# Patient Record
Sex: Male | Born: 1955 | Race: White | Hispanic: No | Marital: Married | Smoking: Former smoker
Health system: Southern US, Community
[De-identification: ages and names within clinical notes are randomized; demographics above are authoritative.]

## PROBLEM LIST (undated history)

## (undated) DIAGNOSIS — M1990 Unspecified osteoarthritis, unspecified site: Secondary | ICD-10-CM

## (undated) HISTORY — DX: Unspecified osteoarthritis, unspecified site: M19.90

---

## 2000-05-15 ENCOUNTER — Encounter: Payer: Self-pay | Admitting: Physical Medicine and Rehabilitation

## 2000-05-15 ENCOUNTER — Ambulatory Visit (HOSPITAL_COMMUNITY)
Admission: RE | Admit: 2000-05-15 | Discharge: 2000-05-15 | Payer: Self-pay | Admitting: Physical Medicine and Rehabilitation

## 2001-05-12 ENCOUNTER — Encounter: Payer: Self-pay | Admitting: Physical Medicine and Rehabilitation

## 2001-05-12 ENCOUNTER — Ambulatory Visit (HOSPITAL_COMMUNITY)
Admission: RE | Admit: 2001-05-12 | Discharge: 2001-05-12 | Payer: Self-pay | Admitting: Physical Medicine and Rehabilitation

## 2004-11-13 ENCOUNTER — Emergency Department (HOSPITAL_COMMUNITY): Admission: EM | Admit: 2004-11-13 | Discharge: 2004-11-13 | Payer: Self-pay | Admitting: Emergency Medicine

## 2007-03-14 ENCOUNTER — Encounter: Admission: RE | Admit: 2007-03-14 | Discharge: 2007-03-14 | Payer: Self-pay | Admitting: Family Medicine

## 2007-04-11 ENCOUNTER — Ambulatory Visit: Payer: Self-pay | Admitting: Internal Medicine

## 2007-05-17 ENCOUNTER — Encounter: Payer: Self-pay | Admitting: Internal Medicine

## 2007-05-17 ENCOUNTER — Ambulatory Visit: Payer: Self-pay | Admitting: Internal Medicine

## 2007-09-11 DIAGNOSIS — R5383 Other fatigue: Secondary | ICD-10-CM

## 2007-09-11 DIAGNOSIS — K648 Other hemorrhoids: Secondary | ICD-10-CM | POA: Insufficient documentation

## 2007-09-11 DIAGNOSIS — K2970 Gastritis, unspecified, without bleeding: Secondary | ICD-10-CM | POA: Insufficient documentation

## 2007-09-11 DIAGNOSIS — R5381 Other malaise: Secondary | ICD-10-CM | POA: Insufficient documentation

## 2007-09-11 DIAGNOSIS — J309 Allergic rhinitis, unspecified: Secondary | ICD-10-CM | POA: Insufficient documentation

## 2007-09-11 DIAGNOSIS — R1013 Epigastric pain: Secondary | ICD-10-CM | POA: Insufficient documentation

## 2007-09-11 DIAGNOSIS — K219 Gastro-esophageal reflux disease without esophagitis: Secondary | ICD-10-CM

## 2007-09-11 DIAGNOSIS — K2990 Gastroduodenitis, unspecified, without bleeding: Secondary | ICD-10-CM

## 2010-11-16 NOTE — Assessment & Plan Note (Signed)
[facility]                         GASTROENTEROLOGY OFFICE NOTE   NAME:Kevin Brady, Kevin Brady                       MRN:          [id number]  DATE:04/11/2007                            DOB:          Apr 09, 1956    CHIEF COMPLAINT:  Pressure and abdominal pain in the epigastric area,  plus colon cancer screening.   ASSESSMENT:  9. A 55 year old white man who has been having problems with      epigastric pressure and pain, often postprandial, but not always,      ever since he took a course of NSAID for an injury several years      ago.  Recently Nexium seemed to help, but then he developed      constipation.  He is not completely relieved at this time.  2. He is 51 and averages for colon cancer screening.   RECOMMENDATIONS AND PLAN:  1. Schedule upper GI endoscopy to investigate this epigastric pain.  2. Schedule screening colonoscopy.  3. He had an abdominal ultrasound.  He says he has had labs and these      are fine.  4. Note, he complains of some fatigue and lab work is negative.  He      snores and sometimes has daily headaches as well as this fatigue,      despite 7 to 8 hours of sleep.  He has had a previous nasal injury.      He does not seem to have the typical body habitus for somebody with      sleep apnea, but that is possible, and he will discuss this with      Dr. Christell Constant (i.e., possible sleep test, will defer to Dr. Kathi Der      judgment).   HISTORY:  A 55 year old white man with problems as outlined above.  See  medical history form for further details.  GI review of systems is  otherwise negative.   PAST MEDICAL HISTORY:  1. History of epidural injection for back problems.  2. Nasal fracture.  3. Allergies have been a recent problem.  Started taking loratadine.  4. Fatigue.  Unclear etiology.   MEDICATIONS:  1. Loratadine 10 mg daily.  2. Multivitamin daily.   ALLERGIES:  PENICILLIN.   FAMILY HISTORY:  Diabetes in his mother.   Father had heart disease and  Wolff-Parkinson-White Syndrome.   SOCIAL HISTORY:  He is married.  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  Previously worked in Designer, fashion/clothing for 20 years.  He has been at  [facility] for 10 years.  Two daughters.  Lives with his wife.  Seldom  alcohol.  No tobacco.   REVIEW OF SYSTEMS:  As above.  See medical history form.   PHYSICAL EXAM:  Height 5 feet 10 inches, weight 163 pounds, blood  pressure 120/82, pulse 78.  Body mass index 23.4 kg per meter squared.  EYES:  Anicteric.  MOUTH:  Posterior pharynx free of lesions.  Teeth in good repair.  NECK:  Supple.  No thyromegaly or mass.  CHEST:  Clear.  HEART:  S1, S2.  No  murmurs, rubs, or gallops.  ABDOMEN:  Soft.  Somewhat protuberant.  Mildly obese.  No organomegaly  or mass.  RECTAL:  Deferred.  LOWER EXTREMITIES:  No edema.  LYMPHATICS:  No neck or supraclavicular adenopathy detected.  SKIN:  Warm and dry.  No acute rash.  PSYCH:  He is alert and oriented x3.   Ultrasound report negative, reviewed.  I reviewed Dr. Kathi Der office  notes as well.   I appreciate the opportunity to care for this patient.     Iva Boop, MD,FACG  Electronically Signed    CEG/MedQ  DD: 04/11/2007  DT: 04/12/2007  Job #: [id number]   cc:   Ernestina Penna, M.D.

## 2013-01-21 ENCOUNTER — Telehealth: Payer: Self-pay | Admitting: Family Medicine

## 2013-01-21 NOTE — Telephone Encounter (Signed)
appt given for wed 23 at 3:30 with moore.

## 2013-01-23 ENCOUNTER — Ambulatory Visit (INDEPENDENT_AMBULATORY_CARE_PROVIDER_SITE_OTHER): Payer: 59 | Admitting: Family Medicine

## 2013-01-23 ENCOUNTER — Encounter: Payer: Self-pay | Admitting: Family Medicine

## 2013-01-23 ENCOUNTER — Ambulatory Visit (INDEPENDENT_AMBULATORY_CARE_PROVIDER_SITE_OTHER): Payer: 59

## 2013-01-23 VITALS — BP 136/80 | HR 76 | Temp 98.5°F | Ht 68.5 in | Wt 173.2 lb

## 2013-01-23 DIAGNOSIS — R51 Headache: Secondary | ICD-10-CM

## 2013-01-23 DIAGNOSIS — R221 Localized swelling, mass and lump, neck: Secondary | ICD-10-CM

## 2013-01-23 DIAGNOSIS — J309 Allergic rhinitis, unspecified: Secondary | ICD-10-CM

## 2013-01-23 LAB — POCT CBC
Granulocyte percent: 69.6 %G (ref 37–80)
HCT, POC: 42.2 % — AB (ref 43.5–53.7)
Hemoglobin: 14.2 g/dL (ref 14.1–18.1)
Lymph, poc: 1.9 (ref 0.6–3.4)
MCH, POC: 30.6 pg (ref 27–31.2)
MCHC: 33.7 g/dL (ref 31.8–35.4)
MCV: 90.7 fL (ref 80–97)
MPV: 8.2 fL (ref 0–99.8)
POC Granulocyte: 5.1 (ref 2–6.9)
POC LYMPH PERCENT: 26.6 %L (ref 10–50)
Platelet Count, POC: 274 10*3/uL (ref 142–424)
RBC: 4.7 M/uL (ref 4.69–6.13)
RDW, POC: 13 %
WBC: 7.3 10*3/uL (ref 4.6–10.2)

## 2013-01-23 MED ORDER — FLUTICASONE PROPIONATE 50 MCG/ACT NA SUSP: 2.0000 | Freq: Every day | NASAL | Status: DC

## 2013-01-23 MED ORDER — NAPROXEN 500 MG PO TABS: 500.0000 mg | ORAL_TABLET | Freq: Two times a day (BID) | ORAL | Status: DC

## 2013-01-23 NOTE — Progress Notes (Signed)
Subjective:    Patient ID: Kevin Brady, male    DOB: 1956-04-23, 57 y.o.   MRN: [id number]  HPI Patient comes in today complaining of a left occipital knot for about one year. He describes frontal headaches off and on during that time. The area in question has not changed much in size since first noticed. Milder NSAIDs do not help but stronger NSAIDs have been noted to help this pain.   Review of Systems  Constitutional: Negative.   Eyes: Negative.   Respiratory: Negative.   Cardiovascular: Negative.   Gastrointestinal: Negative.   Skin: Positive for wound (cyst on neck).  Allergic/Immunologic: Positive for environmental allergies.  Neurological: Positive for headaches.  Psychiatric/Behavioral: The patient is nervous/anxious (anxiety).        Objective:   Physical Exam  Constitutional: He is oriented to person, place, and time. He appears well-developed and well-nourished. No distress.  HENT:  Head: Normocephalic and atraumatic.  Right Ear: External ear normal.  Left Ear: External ear normal.  Nose: Nose normal.  Mouth/Throat: No oropharyngeal exudate.  Eyes: Conjunctivae are normal. Right eye exhibits no discharge. Left eye exhibits no discharge. No scleral icterus.  Neck: Normal range of motion. Neck supple. No thyromegaly present.  Cardiovascular: Normal rate, regular rhythm and normal heart sounds.  Exam reveals no gallop and no friction rub.   No murmur heard. Pulmonary/Chest: Effort normal and breath sounds normal. No respiratory distress. He has no wheezes.  Abdominal: Soft. Bowel sounds are normal. He exhibits no distension. There is no tenderness. There is no rebound and no guarding.  Musculoskeletal: Normal range of motion. He exhibits no edema and no tenderness.  Lymphadenopathy:    He has no cervical adenopathy.  Neurological: He is alert and oriented to person, place, and time. He has normal reflexes.  Skin: Skin is warm and dry. No rash noted. He is not  diaphoretic. No erythema. No pallor.  There is a small wound in the left occipital scalp in the cervical spine area  Psychiatric: He has a normal mood and affect. His behavior is normal. Thought content normal.  Somewhat anxious about his condition     [facility] reading (PRIMARY) by  Dr. Christell Constant: Chest x-ray : Within normal limits                                Results for orders placed in visit on 01/23/13  POCT CBC      Result Value Range   WBC 7.3  4.6 - 10.2 K/uL   Lymph, poc 1.9  0.6 - 3.4   POC LYMPH PERCENT 26.6  10 - 50 %L   POC Granulocyte 5.1  2 - 6.9   Granulocyte percent 69.6  37 - 80 %G   RBC 4.7  4.69 - 6.13 M/uL   Hemoglobin 14.2  14.1 - 18.1 g/dL   HCT, POC 10.2 (*) 72.5 - 53.7 %   MCV 90.7  80 - 97 fL   MCH, POC 30.6  27 - 31.2 pg   MCHC 33.7  31.8 - 35.4 g/dL   RDW, POC 36.6     Platelet Count, POC 274.0  142 - 424 K/uL   MPV 8.2  0 - 99.8 fL          Assessment & Plan:  1. Frequent headaches - POCT CBC - naproxen (NAPROSYN) 500 MG tablet; Take 1 tablet (500 mg total) by mouth 2 (  two) times daily with a meal.  Dispense: 30 tablet; Refill: 0  2. Mass of left side of neck - POCT CBC - DG Chest 2 View; Future  3. Allergic rhinitis - fluticasone (FLONASE) 50 MCG/ACT nasal spray; Place 2 sprays into the nose daily.  Dispense: 16 g; Refill: 6  Patient Instructions  Use nose spray as directed Take Naprosyn after breakfast and supper, if it bothers the stomach, discontinue the medication Use warm wet compresses to neck 20 minutes 3 or 4 times daily We will arrange an appointment with a surgeon to look at the knot   Nyra Capes MD

## 2013-01-23 NOTE — Patient Instructions (Addendum)
Use nose spray as directed Take Naprosyn after breakfast and supper, if it bothers the stomach, discontinue the medication Use warm wet compresses to neck 20 minutes 3 or 4 times daily We will arrange an appointment with a surgeon to look at the knot

## 2013-02-01 ENCOUNTER — Other Ambulatory Visit: Payer: Self-pay | Admitting: Physician Assistant

## 2013-02-07 ENCOUNTER — Ambulatory Visit (INDEPENDENT_AMBULATORY_CARE_PROVIDER_SITE_OTHER): Payer: 59 | Admitting: Surgery

## 2013-03-01 ENCOUNTER — Ambulatory Visit (INDEPENDENT_AMBULATORY_CARE_PROVIDER_SITE_OTHER): Payer: 59 | Admitting: Surgery

## 2013-03-06 ENCOUNTER — Ambulatory Visit (INDEPENDENT_AMBULATORY_CARE_PROVIDER_SITE_OTHER): Payer: 59 | Admitting: Surgery

## 2013-03-06 ENCOUNTER — Encounter (INDEPENDENT_AMBULATORY_CARE_PROVIDER_SITE_OTHER): Payer: Self-pay | Admitting: Surgery

## 2013-03-06 VITALS — BP 130/80 | HR 74 | Resp 14 | Ht 71.0 in | Wt 167.8 lb

## 2013-03-06 DIAGNOSIS — R220 Localized swelling, mass and lump, head: Secondary | ICD-10-CM

## 2013-03-06 DIAGNOSIS — R221 Localized swelling, mass and lump, neck: Secondary | ICD-10-CM

## 2013-03-06 NOTE — Progress Notes (Signed)
Patient ID: Kevin Brady, male   DOB: [birth year], 57 y.o.   MRN: [id number]  Chief Complaint  Patient presents with  . New Evaluation    eval neck mass    HPI Kevin Brady is a 57 y.o. male.  Patient sent at request of Dr. Christell Constant due to  mass just below his left occiput . It has been present for one year. It is not getting larger. It is not tender. He has had problems with headaches and sinus congestion recently. He is under a lot of stress at work. He was placed on Naprosyn and his headaches have gone away. He still has nasal congestion that he attributes to allergies. The mass over his neck is not tender, is not red, it is not draining and is stable in size for one year. HPI  History reviewed. No pertinent past medical history.  History reviewed. No pertinent past surgical history.  History reviewed. No pertinent family history.  Social History History  Substance Use Topics  . Smoking status: Former Smoker -- 0.50 packs/day    Types: Cigarettes    Start date: 03/05/1975    Quit date: 03/04/1977  . Smokeless tobacco: Never Used  . Alcohol Use: Yes     Comment: rare    Allergies  Allergen Reactions  . Penicillins Rash  . Nexium [Esomeprazole Magnesium] Other (See Comments)    constipation  . Paxil [Paroxetine Hcl] Other (See Comments)    fatigue    Current Outpatient Prescriptions  Medication Sig Dispense Refill  . fluticasone (FLONASE) 50 MCG/ACT nasal spray Place 2 sprays into the nose daily.  16 g  6  . naproxen (NAPROSYN) 500 MG tablet Take 1 tablet (500 mg total) by mouth 2 (two) times daily with a meal.  30 tablet  0   No current facility-administered medications for this visit.    Review of Systems Review of Systems  Constitutional: Negative.   HENT: Positive for congestion, rhinorrhea and neck pain.   Respiratory: Negative.   Cardiovascular: Negative.   Endocrine: Negative.   Genitourinary: Negative.   Musculoskeletal: Positive for arthralgias.    Allergic/Immunologic: Negative.   Neurological: Positive for headaches.  Hematological: Negative.   Psychiatric/Behavioral: Negative.     Blood pressure 130/80, pulse 74, resp. rate 14, height 5\' 11"  (1.803 m), weight 167 lb 12.8 oz (76.114 kg).  Physical Exam Physical Exam  Constitutional: He is oriented to person, place, and time. He appears well-developed and well-nourished.  HENT:  Head: Normocephalic and atraumatic.  Eyes: EOM are normal. Pupils are equal, round, and reactive to light.  Neck: Normal range of motion. Neck supple.  Musculoskeletal: Normal range of motion.  Neurological: He is alert and oriented to person, place, and time.  Skin:     Psychiatric: He has a normal mood and affect. His behavior is normal. Judgment and thought content normal.    Data Reviewed none  Assessment    Left posterior neck mass probable lipoma versus sebaceous cyst    Plan    Discussed with patient excision versus observation. The pros and cons of each were discussed. The potential for malignancy this case is very low. He would like to continue to observe this for now since his other symptoms are better. Patient told to call office if the area gets larger, becomes more hard, becomes red, or drains. He understands and agrees.       Malillany Kazlauskas A. 03/06/2013, 10:06 AM

## 2013-03-06 NOTE — Patient Instructions (Signed)

## 2013-09-04 ENCOUNTER — Other Ambulatory Visit: Payer: Self-pay | Admitting: *Deleted

## 2013-09-04 NOTE — Telephone Encounter (Signed)
Pt want muscle relaxer - ntbs!!

## 2013-09-23 ENCOUNTER — Ambulatory Visit (INDEPENDENT_AMBULATORY_CARE_PROVIDER_SITE_OTHER): Payer: 59

## 2013-09-23 ENCOUNTER — Ambulatory Visit (INDEPENDENT_AMBULATORY_CARE_PROVIDER_SITE_OTHER): Payer: 59 | Admitting: Family Medicine

## 2013-09-23 ENCOUNTER — Encounter: Payer: Self-pay | Admitting: Family Medicine

## 2013-09-23 VITALS — BP 145/83 | HR 64 | Temp 98.7°F | Ht 71.0 in | Wt 162.0 lb

## 2013-09-23 DIAGNOSIS — M25519 Pain in unspecified shoulder: Secondary | ICD-10-CM

## 2013-09-23 DIAGNOSIS — M25511 Pain in right shoulder: Secondary | ICD-10-CM

## 2013-09-23 DIAGNOSIS — J309 Allergic rhinitis, unspecified: Secondary | ICD-10-CM

## 2013-09-23 MED ORDER — CETIRIZINE HCL 10 MG PO TABS: 10.0000 mg | ORAL_TABLET | Freq: Every day | ORAL | Status: DC

## 2013-09-23 MED ORDER — CYCLOBENZAPRINE HCL 10 MG PO TABS: 10.0000 mg | ORAL_TABLET | Freq: Three times a day (TID) | ORAL | Status: DC | PRN

## 2013-09-23 MED ORDER — FLUTICASONE PROPIONATE 50 MCG/ACT NA SUSP: 2.0000 | Freq: Every day | NASAL | Status: DC

## 2013-09-23 NOTE — Progress Notes (Signed)
Subjective:    Patient ID: Kevin Brady, male    DOB: 08-Oct-1955, 58 y.o.   MRN: [id number]  HPI Patient here today for right shoulder pain. The patient indicates it hurts most with abduction. He has been hurting for 2-3 months. He does not describe any injury associated with this right shoulder pain. It does hurt to raise it or reach behind. He also complains of allergic rhinitis and his continued back pain. He would like for [place] to refill his Flonase and his Naprosyn.      Patient Active Problem List   Diagnosis Date Noted  . INTERNAL HEMORRHOIDS 09/11/2007  . ALLERGIC RHINITIS 09/11/2007  . GERD 09/11/2007  . GASTRITIS 09/11/2007  . FATIGUE 09/11/2007  . ABDOMINAL PAIN, EPIGASTRIC 09/11/2007   Outpatient Encounter Prescriptions as of 09/23/2013  Medication Sig  . cetirizine (ZYRTEC) 10 MG tablet Take 10 mg by mouth daily.  . fluticasone (FLONASE) 50 MCG/ACT nasal spray Place 2 sprays into the nose daily.  . [DISCONTINUED] naproxen (NAPROSYN) 500 MG tablet Take 1 tablet (500 mg total) by mouth 2 (two) times daily with a meal.    Review of Systems  Constitutional: Negative.   HENT: Negative.   Eyes: Negative.   Respiratory: Negative.   Cardiovascular: Negative.   Gastrointestinal: Negative.   Endocrine: Negative.   Genitourinary: Negative.   Musculoskeletal: Positive for arthralgias (right shoulder pain).  Skin: Negative.   Allergic/Immunologic: Negative.   Neurological: Negative.   Hematological: Negative.   Psychiatric/Behavioral: Negative.        Objective:   Physical Exam  Nursing note and vitals reviewed. Constitutional: He is oriented to person, place, and time. He appears well-developed and well-nourished. No distress.  HENT:  Head: Normocephalic and atraumatic.  Right Ear: External ear normal.  Left Ear: External ear normal.  Nose: Nose normal.  Mouth/Throat: Oropharynx is clear and moist. No oropharyngeal exudate.  Eyes: Conjunctivae and EOM are  normal. Pupils are equal, round, and reactive to light. Right eye exhibits no discharge. Left eye exhibits no discharge. No scleral icterus.  Neck: Normal range of motion. Neck supple. No thyromegaly present.  There is a small fatty tumor or in the left posterior neck that the patient is asymptomatic with.  Cardiovascular: Normal rate, regular rhythm, normal heart sounds and intact distal pulses.  Exam reveals no gallop and no friction rub.   No murmur heard. Pulmonary/Chest: Effort normal and breath sounds normal. No respiratory distress. He has no wheezes. He has no rales. He exhibits no tenderness.  Abdominal: Soft. Bowel sounds are normal.  Genitourinary: Rectum normal, prostate normal and penis normal.  Musculoskeletal: Normal range of motion. He exhibits tenderness (there was slight point tenderness distal to the acromioclavicular joint above the humerus.). He exhibits no edema.  There is pretty normal range of motion with the right humerus but increased pain with abduction and in the superior shoulder area.  Lymphadenopathy:    He has no cervical adenopathy.  Neurological: He is alert and oriented to person, place, and time.  Skin: Skin is warm and dry. No rash noted.  Psychiatric: He has a normal mood and affect. His behavior is normal. Judgment and thought content normal.   BP 145/83  Pulse 64  Temp(Src) 98.7 F (37.1 C) (Oral)  Ht 5\' 11"  (1.803 m)  Wt 162 lb (73.483 kg)  BMI 22.60 kg/m2  1 cc of Marcaine and 20 mg of Kenalog were injected in the area of point tenderness after sterile preparation  without problems to the patient. A pressure dressing was applied  [facility] reading (PRIMARY) by  Dr. Governor Rooks shoulder, no significant finding                                     Assessment & Plan:  1. Right shoulder pain - DG Shoulder Right; Future  2. Allergic rhinitis - fluticasone (FLONASE) 50 MCG/ACT nasal spray; Place 2 sprays into both nostrils daily.  Dispense: 16 g;  Refill: 6 Meds ordered this encounter  Medications  . DISCONTD: cetirizine (ZYRTEC) 10 MG tablet    Sig: Take 10 mg by mouth daily.  . fluticasone (FLONASE) 50 MCG/ACT nasal spray    Sig: Place 2 sprays into both nostrils daily.    Dispense:  16 g    Refill:  6  . cetirizine (ZYRTEC) 10 MG tablet    Sig: Take 1 tablet (10 mg total) by mouth daily.    Dispense:  30 tablet    Refill:  6  . cyclobenzaprine (FLEXERIL) 10 MG tablet    Sig: Take 1 tablet (10 mg total) by mouth 3 (three) times daily as needed for muscle spasms.    Dispense:  30 tablet    Refill:  1   Patient Instructions  You can continue to take the naproxen as needed after breakfast and supper use warm wet compresses to your shoulder If the pain and discomfort are no better and 3-4 weeks please call back and we will arrange for an appointment with the orthopedist     continue to take allergy meds as in the past. Arrie Senate MD

## 2013-09-23 NOTE — Patient Instructions (Signed)
You can continue to take the naproxen as needed after breakfast and supper use warm wet compresses to your shoulder If the pain and discomfort are no better and 3-4 weeks please call back and we will arrange for an appointment with the orthopedist

## 2013-12-03 ENCOUNTER — Ambulatory Visit (INDEPENDENT_AMBULATORY_CARE_PROVIDER_SITE_OTHER): Payer: 59

## 2013-12-03 ENCOUNTER — Telehealth: Payer: Self-pay | Admitting: Family Medicine

## 2013-12-03 ENCOUNTER — Ambulatory Visit (INDEPENDENT_AMBULATORY_CARE_PROVIDER_SITE_OTHER): Payer: 59 | Admitting: Family Medicine

## 2013-12-03 ENCOUNTER — Encounter: Payer: Self-pay | Admitting: Family Medicine

## 2013-12-03 VITALS — BP 143/86 | HR 68 | Temp 98.3°F | Ht 71.0 in | Wt 148.0 lb

## 2013-12-03 DIAGNOSIS — S6000XA Contusion of unspecified finger without damage to nail, initial encounter: Secondary | ICD-10-CM

## 2013-12-03 DIAGNOSIS — M79644 Pain in right finger(s): Secondary | ICD-10-CM

## 2013-12-03 DIAGNOSIS — M79609 Pain in unspecified limb: Secondary | ICD-10-CM

## 2013-12-03 NOTE — Patient Instructions (Addendum)
Take 2  ibuprofen 3 times daily after meals Use the splint during the day at work We will call you with the results of the x-rays once those results are available Take Zantac 75 twice daily before breakfast and supper to protect your stomach

## 2013-12-03 NOTE — Progress Notes (Signed)
   Subjective:    Patient ID: Kevin Brady, male    DOB: 05/28/1956, 58 y.o.   MRN: [id number]  HPI Patient here today for middile finger pain of right hand. This happened when his finger was smashed between 2 different objects about 2 weeks ago. The pain continues. There is still slight swelling over the PIP joint.      Patient Active Problem List   Diagnosis Date Noted  . INTERNAL HEMORRHOIDS 09/11/2007  . ALLERGIC RHINITIS 09/11/2007  . GERD 09/11/2007  . GASTRITIS 09/11/2007  . FATIGUE 09/11/2007  . ABDOMINAL PAIN, EPIGASTRIC 09/11/2007   Outpatient Encounter Prescriptions as of 12/03/2013  Medication Sig  . cetirizine (ZYRTEC) 10 MG tablet Take 1 tablet (10 mg total) by mouth daily.  . cyclobenzaprine (FLEXERIL) 10 MG tablet Take 1 tablet (10 mg total) by mouth 3 (three) times daily as needed for muscle spasms.  . fluticasone (FLONASE) 50 MCG/ACT nasal spray Place 2 sprays into both nostrils daily.    Review of Systems  Constitutional: Negative.   HENT: Negative.   Eyes: Negative.   Respiratory: Negative.   Cardiovascular: Negative.   Gastrointestinal: Negative.   Endocrine: Negative.   Genitourinary: Negative.   Musculoskeletal: Negative.        Finger/knuckle pain right hand - middle finger  Skin: Negative.   Allergic/Immunologic: Negative.   Neurological: Negative.   Hematological: Negative.   Psychiatric/Behavioral: Negative.        Objective:   Physical Exam  Nursing note and vitals reviewed. Constitutional: He is oriented to person, place, and time. He appears well-developed and well-nourished.  Musculoskeletal: He exhibits edema. He exhibits no tenderness.  There is slight swelling of the third finger PIP joint of the right hand. There is no redness or sign of infection. There is no rubor. He does have fairly good flexion. There is slight tenderness at the PIP joint.  Neurological: He is alert and oriented to person, place, and time.  Skin: Skin is warm  and dry. No rash noted. No erythema. No pallor.  Psychiatric: He has a normal mood and affect. His behavior is normal. Thought content normal.   BP 143/86  Pulse 68  Temp(Src) 98.3 F (36.8 C) (Oral)  Ht 5\' 11"  (1.803 m)  Wt 148 lb (67.132 kg)  BMI 20.65 kg/m2 [facility] reading (PRIMARY) by  Dr. Festus Barren finger right hand--- no obvious fracture   The finger was splinted just for protection at work with a metal splint he will remove this when he comes home during the day and wear it at work for a few days                                      Assessment & Plan:  1. Finger pain, right - DG Finger Middle Right; Future  2. Finger contusion Patient Instructions  Take 2  ibuprofen 3 times daily after meals Use the splint during the day at work We will call you with the results of the x-rays once those results are available Take Zantac 75 twice daily before breakfast and supper to protect your stomach   Arrie Senate MD

## 2013-12-04 ENCOUNTER — Telehealth: Payer: Self-pay

## 2013-12-04 NOTE — Telephone Encounter (Signed)
Message copied by Koren Bound on Wed Dec 04, 2013  9:33 AM ------      Message from: Chipper Herb      Created: Tue Dec 03, 2013  5:18 PM       Do we need to get More films to further evaluate this?      Please call patient and let him that there could be a small avulsion fracture in the PIP joint.      He needs to wear this splint more regularly ------

## 2013-12-04 NOTE — Telephone Encounter (Signed)
Wife aware of x-ray results; told to call us back in a couple of weeks if still having pain; wear splint more

## 2013-12-04 NOTE — Telephone Encounter (Signed)
[facility]c to [phone] to receive X-ray Results

## 2014-01-06 ENCOUNTER — Telehealth: Payer: Self-pay | Admitting: Family Medicine

## 2014-01-06 NOTE — Telephone Encounter (Signed)
Appt given for tomorrow per patients request 

## 2014-01-07 ENCOUNTER — Encounter: Payer: Self-pay | Admitting: Family Medicine

## 2014-01-07 ENCOUNTER — Ambulatory Visit (INDEPENDENT_AMBULATORY_CARE_PROVIDER_SITE_OTHER): Payer: 59 | Admitting: Family Medicine

## 2014-01-07 VITALS — BP 126/75 | HR 80 | Temp 98.2°F | Ht 71.0 in | Wt 154.6 lb

## 2014-01-07 DIAGNOSIS — S42309S Unspecified fracture of shaft of humerus, unspecified arm, sequela: Secondary | ICD-10-CM

## 2014-01-07 DIAGNOSIS — S62619S Displaced fracture of proximal phalanx of unspecified finger, sequela: Secondary | ICD-10-CM

## 2014-01-07 NOTE — Patient Instructions (Signed)
Keep appt with hand surgeon on 01/09/14 at 2:45

## 2014-01-07 NOTE — Progress Notes (Signed)
   Subjective:    Patient ID: Kevin Brady, male    DOB: 16-Oct-1955, 58 y.o.   MRN: [id number]  HPI Pt here for follow up of Right 3rd finger injury, continued swelling and immobility. The patient is still having swelling and some pain in the right middle finger secondary to the avulsion fracture of the volar plate. He has been using the splint and over the past week has rested the finger because he has not been working for   Review of Systems  Musculoskeletal: Positive for joint swelling (right 3rd finger, continued).       Objective:   Physical Exam BP 126/75  Pulse 80  Temp(Src) 98.2 F (36.8 C) (Oral)  Ht 5\' 11"  (1.803 m)  Wt 154 lb 9.6 oz (70.126 kg)  BMI 21.57 kg/m2  The third finger remains swollen and he has decreased ability to straighten it or flex it.      Assessment & Plan:

## 2014-05-20 ENCOUNTER — Other Ambulatory Visit: Payer: Self-pay | Admitting: Family Medicine

## 2015-04-20 ENCOUNTER — Other Ambulatory Visit: Payer: Self-pay | Admitting: Family Medicine

## 2015-05-05 MED ORDER — AZITHROMYCIN 250 MG PO TABS: ORAL_TABLET | ORAL | Status: DC

## 2015-05-05 NOTE — Addendum Note (Signed)
Addended by: Ilean China on: 05/05/2015 04:32 PM   Modules accepted: Orders

## 2015-09-17 ENCOUNTER — Other Ambulatory Visit: Payer: Self-pay | Admitting: Family Medicine

## 2015-09-18 MED ORDER — FLUTICASONE PROPIONATE 50 MCG/ACT NA SUSP: 2.0000 | Freq: Every day | NASAL | Status: DC

## 2015-09-18 NOTE — Telephone Encounter (Signed)
Refill sent through electronically

## 2015-11-20 ENCOUNTER — Ambulatory Visit (INDEPENDENT_AMBULATORY_CARE_PROVIDER_SITE_OTHER): Payer: Commercial Managed Care - HMO

## 2015-11-20 ENCOUNTER — Ambulatory Visit (INDEPENDENT_AMBULATORY_CARE_PROVIDER_SITE_OTHER): Payer: Commercial Managed Care - HMO | Admitting: Family Medicine

## 2015-11-20 ENCOUNTER — Encounter: Payer: Self-pay | Admitting: Family Medicine

## 2015-11-20 VITALS — BP 126/81 | HR 83 | Temp 98.2°F | Ht 71.0 in | Wt 171.0 lb

## 2015-11-20 DIAGNOSIS — M542 Cervicalgia: Secondary | ICD-10-CM

## 2015-11-20 DIAGNOSIS — G8929 Other chronic pain: Secondary | ICD-10-CM

## 2015-11-20 DIAGNOSIS — Z0000 Encounter for general adult medical examination without abnormal findings: Secondary | ICD-10-CM | POA: Diagnosis not present

## 2015-11-20 DIAGNOSIS — M545 Low back pain: Secondary | ICD-10-CM

## 2015-11-20 DIAGNOSIS — G4700 Insomnia, unspecified: Secondary | ICD-10-CM

## 2015-11-20 DIAGNOSIS — J301 Allergic rhinitis due to pollen: Secondary | ICD-10-CM

## 2015-11-20 DIAGNOSIS — N400 Enlarged prostate without lower urinary tract symptoms: Secondary | ICD-10-CM

## 2015-11-20 LAB — MICROSCOPIC EXAMINATION
BACTERIA UA: NONE SEEN
EPITHELIAL CELLS (NON RENAL): NONE SEEN /HPF (ref 0–10)
RBC, UA: NONE SEEN /hpf (ref 0–?)
WBC, UA: NONE SEEN /hpf (ref 0–?)

## 2015-11-20 LAB — URINALYSIS, COMPLETE
BILIRUBIN UA: NEGATIVE
Glucose, UA: NEGATIVE
Ketones, UA: NEGATIVE
LEUKOCYTES UA: NEGATIVE
Nitrite, UA: NEGATIVE
PH UA: 6 (ref 5.0–7.5)
Protein, UA: NEGATIVE
RBC UA: NEGATIVE
Specific Gravity, UA: 1.015 (ref 1.005–1.030)
Urobilinogen, Ur: 1 mg/dL (ref 0.2–1.0)

## 2015-11-20 MED ORDER — SUVOREXANT 10 MG PO TABS: 1.0000 | ORAL_TABLET | Freq: Every evening | ORAL | Status: DC | PRN

## 2015-11-20 NOTE — Progress Notes (Signed)
Subjective:    Patient ID: Kevin Brady, male    DOB: July 24, 1955, 60 y.o.   MRN: [id number]  HPI Patient is here today for annual wellness exam and follow up of chronic medical problems. He is only using OTC medications. The patient's main complaint is neck pain. He also has allergies that are seasonable related and trouble sleeping. The patient's father is still living and very healthy at 59 years of age. The patient's mother had diabetes and died of a cerebral aneurysm. He has a brother and his sister and he is the oldest of the 3 and both of these siblings are in good health. The patient continues to work for a large and [employer]. He denies any chest pain or shortness of breath. He has no trouble with heartburn indigestion nausea vomiting diarrhea blood in the stool or black tarry bowel movements. The exception to this is that he does have occasional problems with his hemorrhoids and may have some bleeding associated with this. He said he had an endoscopy and a colonoscopy about 8 years ago and we will check on the current date and at that time everything was good. His bowels move regularly and have a normal consistency and color. He's passing his water without problems.     Patient Active Problem List   Diagnosis Date Noted  . INTERNAL HEMORRHOIDS 09/11/2007  . ALLERGIC RHINITIS 09/11/2007  . GERD 09/11/2007  . GASTRITIS 09/11/2007  . FATIGUE 09/11/2007  . ABDOMINAL PAIN, EPIGASTRIC 09/11/2007   Outpatient Encounter Prescriptions as of 11/20/2015  Medication Sig  . cetirizine (ZYRTEC) 10 MG tablet Take 1 tablet (10 mg total) by mouth daily.  . fluticasone (FLONASE) 50 MCG/ACT nasal spray Place 2 sprays into both nostrils daily.  . [DISCONTINUED] azithromycin (ZITHROMAX) 250 MG tablet Take 2 tablets on first day and then 1 tablet daily for 4 days  . [DISCONTINUED] cyclobenzaprine (FLEXERIL) 10 MG tablet Take 1 tablet (10 mg total) by mouth 3 (three) times daily as needed for muscle  spasms.   No facility-administered encounter medications on file as of 11/20/2015.      Review of Systems  Constitutional: Negative.        Trouble sleeping  HENT: Negative.        Seasonal allergies  Eyes: Negative.   Respiratory: Negative.   Cardiovascular: Negative.   Gastrointestinal: Negative.   Endocrine: Negative.   Genitourinary: Negative.   Musculoskeletal: Positive for neck pain.  Skin: Negative.   Allergic/Immunologic: Negative.   Neurological: Negative.   Hematological: Negative.   Psychiatric/Behavioral: Negative.        Objective:   Physical Exam  Constitutional: He is oriented to person, place, and time. He appears well-developed and well-nourished. No distress.  HENT:  Head: Normocephalic and atraumatic.  Right Ear: External ear normal.  Left Ear: External ear normal.  Nose: Nose normal.  Mouth/Throat: Oropharynx is clear and moist. No oropharyngeal exudate.  Minimal congestion. During the visit today as far as nasal is concerned ears are concerned and throat is concerned.  Eyes: Conjunctivae and EOM are normal. Pupils are equal, round, and reactive to light. Right eye exhibits no discharge. Left eye exhibits no discharge. No scleral icterus.  The patient indicates he had an eye exam in the past year.  Neck: Normal range of motion. Neck supple. No thyromegaly present.  No thyromegaly anterior cervical adenopathy or bruits.  Cardiovascular: Normal rate, regular rhythm, normal heart sounds and intact distal pulses.   No murmur heard.  Heart has a regular rate and rhythm at 60/m  Pulmonary/Chest: Effort normal and breath sounds normal. No respiratory distress. He has no wheezes. He has no rales. He exhibits no tenderness.  Clear anteriorly and posteriorly  Abdominal: Soft. Bowel sounds are normal. He exhibits no mass. There is no tenderness. There is no rebound and no guarding.  Generalized slight abdominal tenderness without liver or spleen enlargement and no  inguinal adenopathy and no bruits  Genitourinary: Rectum normal and penis normal.  The prostate was enlarged but soft and smooth. There were no rectal masses. There are no inguinal hernias palpable. Genitalia were within normal limits and the patient is uncircumcised.  Musculoskeletal: Normal range of motion. He exhibits no edema or tenderness.  Lymphadenopathy:    He has no cervical adenopathy.  Neurological: He is alert and oriented to person, place, and time. He has normal reflexes. No cranial nerve deficit.  Skin: Skin is warm and dry. No rash noted.  Psychiatric: He has a normal mood and affect. His behavior is normal. Judgment and thought content normal.  Nursing note and vitals reviewed.  BP 126/81 mmHg  Pulse 83  Temp(Src) 98.2 F (36.8 C) (Oral)  Ht _0  (1.803 m)  Wt 171 lb (77.565 kg)  BMI 23.86 kg/m2  EKG: Low voltage with occasional PAC.  [facility] reading (PRIMARY) by  Dr. Brunilda Payor x-ray with results pending                                         Assessment & Plan:  1. Annual physical exam - BMP8+EGFR; Future - CBC with Differential/Platelet; Future - Hepatic function panel; Future - NMR, lipoprofile; Future - Vitamin B12; Future - Thyroid Panel With TSH; Future - Urinalysis, Complete - EKG 12-Lead - DG Chest 2 View; Future - Fecal occult blood, imunochemical; Future  2. Allergic rhinitis due to pollen -Use Flonase regularly 2 sprays each nostril -Use nasal saline frequently during the day -Use nonsedating antihistamine like Claritin or Allegra  3. Insomnia -Trial of Belsomra starting with 10 mg patient will be informed to increase the 20th 10 doesn't work  4. Chronic low back pain -Continue with over-the-counter NSAIDs making sure to take these after eating. Prescription NSAIDs are stronger and cause more side effects with blood pressure and GI symptoms  5. Neck pain -Over-the-counter NSAIDs after eating  6. BPH (benign prostatic  hyperplasia) -The patient was made aware of the enlarged prostate but currently is having no symptoms with this.  Meds ordered this encounter  Medications  . Suvorexant (BELSOMRA) 10 MG TABS    Sig: Take 1 tablet by mouth at bedtime as needed.    Dispense:  30 tablet    Refill:  0   Patient Instructions  Continue current medications. Continue good therapeutic lifestyle changes which include good diet and exercise. Fall precautions discussed with patient. If an FOBT was given today- please return it to our front desk. If you are over 76 years old - you may need Prevnar 79 or the adult Pneumonia vaccine.   After your visit with [place] today you will receive a survey in the mail or online from [employer] regarding your care with [place]. Please take a moment to fill this out. Your feedback is very important to [place] as you can help [place] better understand your patient needs as well as improve your experience and satisfaction. WE  CARE ABOUT YOU!!!   Continue to be careful do not put yourself at risk for falling Use Flonase regularly 2 sprays each nostril at bedtime Avoid sedating antihistamines like Zyrtec and Benadryl and only use nonsedating antihistamines like Claritin and Allegra Use nasal saline frequently in each nostril Please let us know who did your last colonoscopy so we can enter that information into the record Continue to get eye exams yearly Try Belsomra for sleep start with 10 mg nightly for several nights in a this doesn't work increase it to 20 mg nightly and call us and we will call in a new prescription for 20 mg.   Arrie Senate MD

## 2015-11-20 NOTE — Patient Instructions (Addendum)
Continue current medications. Continue good therapeutic lifestyle changes which include good diet and exercise. Fall precautions discussed with patient. If an FOBT was given today- please return it to our front desk. If you are over 59 years old - you may need Prevnar 26 or the adult Pneumonia vaccine.   After your visit with [place] today you will receive a survey in the mail or online from [employer] regarding your care with [place]. Please take a moment to fill this out. Your feedback is very important to [place] as you can help [place] better understand your patient needs as well as improve your experience and satisfaction. WE CARE ABOUT YOU!!!   Continue to be careful do not put yourself at risk for falling Use Flonase regularly 2 sprays each nostril at bedtime Avoid sedating antihistamines like Zyrtec and Benadryl and only use nonsedating antihistamines like Claritin and Allegra Use nasal saline frequently in each nostril Please let us know who did your last colonoscopy so we can enter that information into the record Continue to get eye exams yearly Try Belsomra for sleep start with 10 mg nightly for several nights in a this doesn't work increase it to 20 mg nightly and call us and we will call in a new prescription for 20 mg.

## 2015-11-21 ENCOUNTER — Other Ambulatory Visit: Payer: Commercial Managed Care - HMO

## 2015-11-21 DIAGNOSIS — Z0000 Encounter for general adult medical examination without abnormal findings: Secondary | ICD-10-CM

## 2015-11-22 LAB — HEPATIC FUNCTION PANEL
ALT: 13 IU/L (ref 0–44)
AST: 20 IU/L (ref 0–40)
Albumin: 4.2 g/dL (ref 3.5–5.5)
Alkaline Phosphatase: 110 IU/L (ref 39–117)
Bilirubin Total: 0.6 mg/dL (ref 0.0–1.2)
Bilirubin, Direct: 0.19 mg/dL (ref 0.00–0.40)
TOTAL PROTEIN: 6.5 g/dL (ref 6.0–8.5)

## 2015-11-22 LAB — CBC WITH DIFFERENTIAL/PLATELET
BASOS ABS: 0.1 10*3/uL (ref 0.0–0.2)
BASOS: 1 %
EOS (ABSOLUTE): 0.1 10*3/uL (ref 0.0–0.4)
Eos: 3 %
Hematocrit: 46.3 % (ref 37.5–51.0)
Hemoglobin: 15.4 g/dL (ref 12.6–17.7)
IMMATURE GRANS (ABS): 0 10*3/uL (ref 0.0–0.1)
Immature Granulocytes: 0 %
LYMPHS: 30 %
Lymphocytes Absolute: 1.3 10*3/uL (ref 0.7–3.1)
MCH: 31.9 pg (ref 26.6–33.0)
MCHC: 33.3 g/dL (ref 31.5–35.7)
MCV: 96 fL (ref 79–97)
MONOS ABS: 0.3 10*3/uL (ref 0.1–0.9)
Monocytes: 6 %
NEUTROS ABS: 2.6 10*3/uL (ref 1.4–7.0)
NEUTROS PCT: 60 %
PLATELETS: 278 10*3/uL (ref 150–379)
RBC: 4.83 x10E6/uL (ref 4.14–5.80)
RDW: 13.1 % (ref 12.3–15.4)
WBC: 4.4 10*3/uL (ref 3.4–10.8)

## 2015-11-22 LAB — BMP8+EGFR
BUN/Creatinine Ratio: 13 (ref 9–20)
BUN: 11 mg/dL (ref 6–24)
CALCIUM: 9.1 mg/dL (ref 8.7–10.2)
CHLORIDE: 103 mmol/L (ref 96–106)
CO2: 24 mmol/L (ref 18–29)
Creatinine, Ser: 0.84 mg/dL (ref 0.76–1.27)
GFR, EST AFRICAN AMERICAN: 112 mL/min/{1.73_m2} (ref >59)
GFR, EST NON AFRICAN AMERICAN: 97 mL/min/{1.73_m2} (ref >59)
Glucose: 89 mg/dL (ref 65–99)
POTASSIUM: 4.7 mmol/L (ref 3.5–5.2)
SODIUM: 143 mmol/L (ref 134–144)

## 2015-11-22 LAB — NMR, LIPOPROFILE
CHOLESTEROL: 172 mg/dL (ref 100–199)
HDL CHOLESTEROL BY NMR: 50 mg/dL (ref >39)
HDL Particle Number: 27.3 umol/L — ABNORMAL LOW (ref >=30.5)
LDL PARTICLE NUMBER: 1173 nmol/L — AB (ref <1000)
LDL Size: 21.3 nm (ref >20.5)
LDL-C: 109 mg/dL — AB (ref 0–99)
LP-IR Score: <25 (ref <=45)
Small LDL Particle Number: 289 nmol/L (ref <=527)
TRIGLYCERIDES BY NMR: 64 mg/dL (ref 0–149)

## 2015-11-22 LAB — THYROID PANEL WITH TSH
FREE THYROXINE INDEX: 2.3 (ref 1.2–4.9)
T3 Uptake Ratio: 29 % (ref 24–39)
T4 TOTAL: 7.9 ug/dL (ref 4.5–12.0)
TSH: 3.14 u[IU]/mL (ref 0.450–4.500)

## 2015-11-22 LAB — VITAMIN B12: Vitamin B-12: 467 pg/mL (ref 211–946)

## 2015-11-23 ENCOUNTER — Telehealth: Payer: Self-pay

## 2015-11-23 NOTE — Telephone Encounter (Signed)
[facility] to x-ray 

## 2015-11-23 NOTE — Telephone Encounter (Signed)
Wife aware of CXR results

## 2015-11-24 ENCOUNTER — Other Ambulatory Visit: Payer: Commercial Managed Care - HMO

## 2015-11-24 DIAGNOSIS — Z0000 Encounter for general adult medical examination without abnormal findings: Secondary | ICD-10-CM

## 2015-11-26 ENCOUNTER — Encounter: Payer: Self-pay | Admitting: *Deleted

## 2015-11-26 LAB — FECAL OCCULT BLOOD, IMMUNOCHEMICAL: FECAL OCCULT BLD: NEGATIVE

## 2015-12-28 ENCOUNTER — Telehealth: Payer: Self-pay | Admitting: Family Medicine

## 2015-12-28 NOTE — Telephone Encounter (Signed)
Should we do the xanax as well?

## 2015-12-28 NOTE — Telephone Encounter (Signed)
Since he says the Belsomra helps some maybe we should go on up to 15 mg. Before calling the 10 mg in.

## 2015-12-28 NOTE — Telephone Encounter (Signed)
Was he taking the Xanax regularly? Was he taking it just to help him rest at nighttime? If he was just taking it for the second reason he does not need it if he is taking Balsamo.

## 2015-12-28 NOTE — Telephone Encounter (Signed)
Dr Laurance Flatten - i can approve the belsomra and phone in  -- the xanax is not on the list  - would need strength and directions if you want me to phone this in?

## 2015-12-29 NOTE — Telephone Encounter (Signed)
He wants the xanax for anxiety occasionally during the day-  just to have on hand  - will use PRN   Not filled since 2014  Was xanax 0.5 then   Will try Belsomra 15 mg  - rx to be phoned in

## 2015-12-29 NOTE — Telephone Encounter (Signed)
May do so and xanax 0.25 one daily if needed for anxiety #30

## 2015-12-31 MED ORDER — SUVOREXANT 15 MG PO TABS: 1.0000 | ORAL_TABLET | Freq: Every evening | ORAL | Status: DC | PRN

## 2015-12-31 MED ORDER — ALPRAZOLAM 0.25 MG PO TABS: 0.2500 mg | ORAL_TABLET | Freq: Every day | ORAL | Status: DC | PRN

## 2015-12-31 NOTE — Telephone Encounter (Signed)
Wife aware - both phoned in

## 2016-04-07 ENCOUNTER — Other Ambulatory Visit: Payer: Self-pay

## 2016-04-07 MED ORDER — FLUTICASONE PROPIONATE 50 MCG/ACT NA SUSP: 2.0000 | Freq: Every day | NASAL | 0 refills | Status: DC

## 2016-06-17 ENCOUNTER — Other Ambulatory Visit: Payer: Self-pay | Admitting: Family Medicine

## 2016-06-17 NOTE — Telephone Encounter (Signed)
Last filled 05/18/16, last seen 11/20/15. Call in  At [facility]

## 2016-09-15 DIAGNOSIS — H04123 Dry eye syndrome of bilateral lacrimal glands: Secondary | ICD-10-CM | POA: Diagnosis not present

## 2016-09-15 DIAGNOSIS — H40033 Anatomical narrow angle, bilateral: Secondary | ICD-10-CM | POA: Diagnosis not present

## 2016-11-21 ENCOUNTER — Ambulatory Visit (INDEPENDENT_AMBULATORY_CARE_PROVIDER_SITE_OTHER): Payer: Commercial Managed Care - HMO

## 2016-11-21 ENCOUNTER — Encounter: Payer: Self-pay | Admitting: Family Medicine

## 2016-11-21 ENCOUNTER — Ambulatory Visit (INDEPENDENT_AMBULATORY_CARE_PROVIDER_SITE_OTHER): Payer: Commercial Managed Care - HMO | Admitting: Family Medicine

## 2016-11-21 VITALS — BP 135/84 | HR 87 | Temp 97.0°F | Ht 71.0 in | Wt 179.0 lb

## 2016-11-21 DIAGNOSIS — M79641 Pain in right hand: Secondary | ICD-10-CM | POA: Diagnosis not present

## 2016-11-21 DIAGNOSIS — M542 Cervicalgia: Secondary | ICD-10-CM

## 2016-11-21 DIAGNOSIS — N400 Enlarged prostate without lower urinary tract symptoms: Secondary | ICD-10-CM

## 2016-11-21 DIAGNOSIS — J301 Allergic rhinitis due to pollen: Secondary | ICD-10-CM

## 2016-11-21 DIAGNOSIS — Z0000 Encounter for general adult medical examination without abnormal findings: Secondary | ICD-10-CM

## 2016-11-21 LAB — MICROSCOPIC EXAMINATION
Bacteria, UA: NONE SEEN
Epithelial Cells (non renal): NONE SEEN /hpf (ref 0–10)
RBC, UA: NONE SEEN /hpf (ref 0–?)
Renal Epithel, UA: NONE SEEN /hpf
WBC UA: NONE SEEN /HPF (ref 0–?)

## 2016-11-21 LAB — URINALYSIS, COMPLETE
BILIRUBIN UA: NEGATIVE
GLUCOSE, UA: NEGATIVE
KETONES UA: NEGATIVE
Leukocytes, UA: NEGATIVE
Nitrite, UA: NEGATIVE
PROTEIN UA: NEGATIVE
RBC, UA: NEGATIVE
UUROB: 0.2 mg/dL (ref 0.2–1.0)
pH, UA: 6 (ref 5.0–7.5)

## 2016-11-21 NOTE — Progress Notes (Signed)
Subjective:    Patient ID: Kevin Brady, male    DOB: 04-09-1956, 61 y.o.   MRN: [id number]  HPI Patient is here today for annual wellness exam and follow up of chronic medical problems which includes gerd and bph. He is taking medication regularly.The patient today comes in for his annual exam is complaining of arthralgias his right hand and neck. His vital signs are stable. 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 His hands a been bothering him more consistently recently than in the past. The right is worse than the left especially with stiffness in the knuckles. There is never any fever or rubor. He is always had some neck problems but this seems to be worse recently also. He has not had any cervical spine injury. He denies any chest pain or shortness of breath he denies any nausea vomiting heartburn blood in the stool black tarry bowel movements or change in bowel habits. He recently went on a cruise and put on several pounds of weight on the cruise. He's passing his water well without problems. He had an eye exam in March of this year and everything was good with that. He says his last colonoscopy was about 2011 and he had it across in front of [place].    Patient Active Problem List   Diagnosis Date Noted  . INTERNAL HEMORRHOIDS 09/11/2007  . ALLERGIC RHINITIS 09/11/2007  . GERD 09/11/2007  . GASTRITIS 09/11/2007  . FATIGUE 09/11/2007  . ABDOMINAL PAIN, EPIGASTRIC 09/11/2007   Outpatient Encounter Prescriptions as of 11/21/2016  Medication Sig  . ALPRAZolam (XANAX) 0.25 MG tablet Take 1 Tablet by mouth once daily as needed for anxiety  . BELSOMRA 15 MG TABS Take 1 Tablet by mouth at bedtime as needed  . cetirizine (ZYRTEC) 10 MG tablet Take 1 tablet (10 mg total) by mouth daily.  . fluticasone (FLONASE) 50 MCG/ACT nasal spray Place 2 sprays into both nostrils daily.   No facility-administered encounter medications on file as of 11/21/2016.       Review of Systems    Constitutional: Negative.   HENT: Negative.   Eyes: Negative.   Respiratory: Negative.   Cardiovascular: Negative.   Gastrointestinal: Negative.   Endocrine: Negative.   Genitourinary: Negative.   Musculoskeletal: Positive for arthralgias.  Skin: Negative.   Allergic/Immunologic: Negative.   Neurological: Negative.   Hematological: Negative.   Psychiatric/Behavioral: Negative.        Objective:   Physical Exam  Constitutional: He is oriented to person, place, and time. He appears well-developed and well-nourished.  The patient is pleasant and alert and unfortunately has gained several pounds of weight because of a recent cruise and he says he is working on this now.  HENT:  Head: Normocephalic and atraumatic.  Right Ear: External ear normal.  Left Ear: External ear normal.  Nose: Nose normal.  Mouth/Throat: Oropharynx is clear and moist. No oropharyngeal exudate.  Eyes: Conjunctivae and EOM are normal. Pupils are equal, round, and reactive to light. Right eye exhibits no discharge. Left eye exhibits no discharge. No scleral icterus.  Neck: Normal range of motion. Neck supple. No thyromegaly present.  No bruits thyromegaly or anterior cervical adenopathy. 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 He has fairly good mobility of the neck today.  Cardiovascular: Normal rate, regular rhythm, normal heart sounds and intact distal pulses.  No murmur heard. The heart is regular at 72/m  Pulmonary/Chest: Effort normal and breath sounds normal. No respiratory distress. He has no wheezes. He has no rales. He exhibits no tenderness.  Clear anteriorly and posteriorly no axillary adenopathy  Abdominal: Soft. Bowel sounds are normal. He exhibits no mass. There is no tenderness. There is no rebound and no guarding.  No abdominal tenderness or organ enlargement or bruits  Genitourinary: Rectum normal and penis  normal.  Genitourinary Comments: The prostate is slightly enlarged with the left being larger than the right and is smooth. There are no rectal masses. The external genitalia were within normal limits and there were no inguinal hernias palpable and no inguinal nodes.  Musculoskeletal: Normal range of motion. He exhibits no edema.  You were no swelling in any of the joints of either hands and no rubor.  Lymphadenopathy:    He has no cervical adenopathy.  Neurological: He is alert and oriented to person, place, and time. He has normal reflexes. No cranial nerve deficit.  Skin: Skin is warm and dry. No rash noted.  Psychiatric: He has a normal mood and affect. His behavior is normal. Judgment and thought content normal.  Nursing note and vitals reviewed.   BP 135/84 (BP Location: Left Arm)   Pulse 87   Temp 97 F (36.1 C) (Oral)   Ht _0  (1.803 m)   Wt 179 lb (81.2 kg)   BMI 24.97 kg/m   Right hand films and C-spine films are still pending====     Assessment & Plan:  1. Annual physical exam -On checking his history he is due to have a colonoscopy this fall. He understands that Dr. Carlean Purl did the last 1 and should be calling him sometime late in the summer to schedule his routine colonoscopy in the fall. - CBC with Differential/Platelet; Future - BMP8+EGFR; Future - Hepatic function panel; Future - VITAMIN D 25 Hydroxy (Vit-D Deficiency, Fractures); Future - Lipid panel; Future - Urinalysis, Complete  2. Benign prostatic hyperplasia, unspecified whether lower urinary tract symptoms present -The prostate is enlarged but soft without lumps or masses. - CBC with Differential/Platelet; Future - Urinalysis, Complete  3. Neck pain -There is neck pain with certain movements and this seems to be increasing in frequency so we will get the C-spine films. - DG Cervical Spine Complete; Future - CYCLIC CITRUL PEPTIDE ANTIBODY, IGG/IGA; Future  4. Right hand pain -Both hands have  stiffness in the knuckles especially with awakening in the morning. The right hand is worse than the left. - DG Hand Complete Right; Future - CYCLIC CITRUL PEPTIDE ANTIBODY, IGG/IGA; Future  5. Non-seasonal allergic rhinitis due to pollen -The patient does use a wood stove. He is using Flonase regularly and is taking Zyrtec and sometimes has to take an extra Zyrtec to control his allergy symptoms.   Patient Instructions                       Medicare Annual Wellness Visit  [facility] and the medical providers at [facility] strive to bring you the best medical care.  In doing so we not only want to address your current medical conditions and concerns but also to detect new conditions early and prevent illness, disease and health-related problems.    Medicare offers a yearly Wellness Visit which allows our clinical staff to assess your need for preventative services including immunizations, lifestyle education, counseling to decrease risk of preventable diseases  and screening for fall risk and other medical concerns.    This visit is provided free of charge (no copay) for all Medicare recipients. The clinical pharmacists at [facility] have begun to conduct these Wellness Visits which will also include a thorough review of all your medications.    As you primary medical provider recommend that you make an appointment for your Annual Wellness Visit if you have not done so already this year.  You may set up this appointment before you leave today or you may call back ([phone]) and schedule an appointment.  Please make sure when you call that you mention that you are scheduling your Annual Wellness Visit with the clinical pharmacist so that the appointment may be made for the proper length of time.     Continue current medications. Continue good therapeutic lifestyle changes which include good diet and exercise. Fall precautions discussed with  patient. If an FOBT was given today- please return it to our front desk. If you are over 27 years old - you may need Prevnar 39 or the adult Pneumonia vaccine.  **Flu shots are available--- please call and schedule a FLU-CLINIC appointment**  After your visit with [place] today you will receive a survey in the mail or online from [employer] regarding your care with [place]. Please take a moment to fill this out. Your feedback is very important to [place] as you can help [place] better understand your patient needs as well as improve your experience and satisfaction. WE CARE ABOUT YOU!!!   Please return in the future lab work fasting We will call with results of the cervical spine films and the right hand films as soon as those results become available We will also call the lab work results as soon as it becomes available Please return the FOBT Please remember that you argued to get your colonoscopy this fall from Dr. Orvan Seen MD

## 2016-11-21 NOTE — Patient Instructions (Addendum)
Medicare Annual Wellness Visit  [facility] and the medical providers at [facility] strive to bring you the best medical care.  In doing so we not only want to address your current medical conditions and concerns but also to detect new conditions early and prevent illness, disease and health-related problems.    Medicare offers a yearly Wellness Visit which allows our clinical staff to assess your need for preventative services including immunizations, lifestyle education, counseling to decrease risk of preventable diseases and screening for fall risk and other medical concerns.    This visit is provided free of charge (no copay) for all Medicare recipients. The clinical pharmacists at [facility] have begun to conduct these Wellness Visits which will also include a thorough review of all your medications.    As you primary medical provider recommend that you make an appointment for your Annual Wellness Visit if you have not done so already this year.  You may set up this appointment before you leave today or you may call back [phone]) and schedule an appointment.  Please make sure when you call that you mention that you are scheduling your Annual Wellness Visit with the clinical pharmacist so that the appointment may be made for the proper length of time.     Continue current medications. Continue good therapeutic lifestyle changes which include good diet and exercise. Fall precautions discussed with patient. If an FOBT was given today- please return it to our front desk. If you are over 45 years old - you may need Prevnar 75 or the adult Pneumonia vaccine.  **Flu shots are available--- please call and schedule a FLU-CLINIC appointment**  After your visit with [place] today you will receive a survey in the mail or online from [employer] regarding your care with [place]. Please take a moment to fill this out. Your feedback is very  important to [place] as you can help [place] better understand your patient needs as well as improve your experience and satisfaction. WE CARE ABOUT YOU!!!   Please return in the future lab work fasting We will call with results of the cervical spine films and the right hand films as soon as those results become available We will also call the lab work results as soon as it becomes available Please return the FOBT Please remember that you argued to get your colonoscopy this fall from Dr. Carlean Purl

## 2016-11-23 ENCOUNTER — Other Ambulatory Visit: Payer: Self-pay | Admitting: *Deleted

## 2016-11-23 DIAGNOSIS — M9981 Other biomechanical lesions of cervical region: Secondary | ICD-10-CM

## 2016-11-23 DIAGNOSIS — M779 Enthesopathy, unspecified: Secondary | ICD-10-CM

## 2016-11-23 DIAGNOSIS — M4802 Spinal stenosis, cervical region: Secondary | ICD-10-CM

## 2016-11-23 DIAGNOSIS — R9389 Abnormal findings on diagnostic imaging of other specified body structures: Secondary | ICD-10-CM

## 2016-11-23 DIAGNOSIS — M542 Cervicalgia: Secondary | ICD-10-CM

## 2016-11-25 ENCOUNTER — Other Ambulatory Visit: Payer: Self-pay | Admitting: *Deleted

## 2016-11-25 ENCOUNTER — Other Ambulatory Visit (INDEPENDENT_AMBULATORY_CARE_PROVIDER_SITE_OTHER): Payer: Commercial Managed Care - HMO

## 2016-11-25 ENCOUNTER — Ambulatory Visit (INDEPENDENT_AMBULATORY_CARE_PROVIDER_SITE_OTHER): Payer: Commercial Managed Care - HMO

## 2016-11-25 ENCOUNTER — Other Ambulatory Visit: Payer: Commercial Managed Care - HMO

## 2016-11-25 ENCOUNTER — Telehealth: Payer: Self-pay | Admitting: *Deleted

## 2016-11-25 DIAGNOSIS — M542 Cervicalgia: Secondary | ICD-10-CM | POA: Diagnosis not present

## 2016-11-25 DIAGNOSIS — Z0000 Encounter for general adult medical examination without abnormal findings: Secondary | ICD-10-CM | POA: Diagnosis not present

## 2016-11-25 DIAGNOSIS — M549 Dorsalgia, unspecified: Secondary | ICD-10-CM

## 2016-11-25 DIAGNOSIS — N400 Enlarged prostate without lower urinary tract symptoms: Secondary | ICD-10-CM | POA: Diagnosis not present

## 2016-11-25 DIAGNOSIS — M79641 Pain in right hand: Secondary | ICD-10-CM

## 2016-11-25 NOTE — Progress Notes (Signed)
Pt called and aware that [facility] suggest he get a t- spine regular film

## 2016-11-26 ENCOUNTER — Encounter: Payer: Self-pay | Admitting: Family Medicine

## 2016-11-26 DIAGNOSIS — E785 Hyperlipidemia, unspecified: Secondary | ICD-10-CM | POA: Insufficient documentation

## 2016-11-27 LAB — BMP8+EGFR
BUN/Creatinine Ratio: 12 (ref 9–20)
BUN: 11 mg/dL (ref 6–24)
CHLORIDE: 103 mmol/L (ref 96–106)
CO2: 24 mmol/L (ref 18–29)
CREATININE: 0.91 mg/dL (ref 0.76–1.27)
Calcium: 9.1 mg/dL (ref 8.7–10.2)
GFR calc Af Amer: 106 mL/min/{1.73_m2} (ref >59)
GFR calc non Af Amer: 92 mL/min/{1.73_m2} (ref >59)
Glucose: 90 mg/dL (ref 65–99)
Potassium: 4.6 mmol/L (ref 3.5–5.2)
Sodium: 143 mmol/L (ref 134–144)

## 2016-11-27 LAB — CBC WITH DIFFERENTIAL/PLATELET
BASOS: 1 %
Basophils Absolute: 0.1 10*3/uL (ref 0.0–0.2)
EOS (ABSOLUTE): 0.1 10*3/uL (ref 0.0–0.4)
EOS: 2 %
HEMATOCRIT: 47.3 % (ref 37.5–51.0)
Hemoglobin: 15.8 g/dL (ref 13.0–17.7)
IMMATURE GRANS (ABS): 0 10*3/uL (ref 0.0–0.1)
IMMATURE GRANULOCYTES: 0 %
LYMPHS: 31 %
Lymphocytes Absolute: 1.5 10*3/uL (ref 0.7–3.1)
MCH: 30.7 pg (ref 26.6–33.0)
MCHC: 33.4 g/dL (ref 31.5–35.7)
MCV: 92 fL (ref 79–97)
Monocytes Absolute: 0.5 10*3/uL (ref 0.1–0.9)
Monocytes: 9 %
Neutrophils Absolute: 2.9 10*3/uL (ref 1.4–7.0)
Neutrophils: 57 %
PLATELETS: 319 10*3/uL (ref 150–379)
RBC: 5.14 x10E6/uL (ref 4.14–5.80)
RDW: 13.3 % (ref 12.3–15.4)
WBC: 5 10*3/uL (ref 3.4–10.8)

## 2016-11-27 LAB — LIPID PANEL
CHOLESTEROL TOTAL: 166 mg/dL (ref 100–199)
Chol/HDL Ratio: 3.9 ratio (ref 0.0–5.0)
HDL: 43 mg/dL (ref >39)
LDL CALC: 106 mg/dL — AB (ref 0–99)
Triglycerides: 87 mg/dL (ref 0–149)
VLDL CHOLESTEROL CAL: 17 mg/dL (ref 5–40)

## 2016-11-27 LAB — HEPATIC FUNCTION PANEL
ALK PHOS: 129 IU/L — AB (ref 39–117)
ALT: 15 IU/L (ref 0–44)
AST: 20 IU/L (ref 0–40)
Albumin: 4.2 g/dL (ref 3.5–5.5)
BILIRUBIN, DIRECT: 0.17 mg/dL (ref 0.00–0.40)
Bilirubin Total: 0.6 mg/dL (ref 0.0–1.2)
TOTAL PROTEIN: 6.4 g/dL (ref 6.0–8.5)

## 2016-11-27 LAB — VITAMIN D 25 HYDROXY (VIT D DEFICIENCY, FRACTURES): Vit D, 25-Hydroxy: 16.4 ng/mL — ABNORMAL LOW (ref 30.0–100.0)

## 2016-11-27 LAB — CYCLIC CITRUL PEPTIDE ANTIBODY, IGG/IGA: Cyclic Citrullin Peptide Ab: 2 units (ref 0–19)

## 2016-11-29 ENCOUNTER — Other Ambulatory Visit: Payer: Self-pay | Admitting: *Deleted

## 2016-11-29 DIAGNOSIS — R7989 Other specified abnormal findings of blood chemistry: Secondary | ICD-10-CM

## 2016-11-29 DIAGNOSIS — R945 Abnormal results of liver function studies: Principal | ICD-10-CM

## 2016-11-29 MED ORDER — VITAMIN D (ERGOCALCIFEROL) 1.25 MG (50000 UNIT) PO CAPS: 50000.0000 [IU] | ORAL_CAPSULE | ORAL | 1 refills | Status: DC

## 2016-12-08 ENCOUNTER — Ambulatory Visit (HOSPITAL_COMMUNITY): Payer: Self-pay

## 2016-12-23 NOTE — Telephone Encounter (Signed)
Error

## 2017-03-07 ENCOUNTER — Other Ambulatory Visit: Payer: Self-pay | Admitting: Family Medicine

## 2017-03-07 NOTE — Telephone Encounter (Signed)
Please call in alprazolam with 1 refills 

## 2017-03-08 NOTE — Telephone Encounter (Signed)
Called to [facility]

## 2017-04-06 ENCOUNTER — Other Ambulatory Visit: Payer: Self-pay | Admitting: Nurse Practitioner

## 2017-04-07 NOTE — Telephone Encounter (Signed)
Patient last seen in office on 11/21/16. Rx last called in on 03/07/17 for #30 with 1RF. It was just refilled on 04/06/17. Pharmacy requesting refills to be put on file. Please advise

## 2017-06-30 ENCOUNTER — Encounter: Payer: Self-pay | Admitting: Internal Medicine

## 2017-09-01 ENCOUNTER — Other Ambulatory Visit: Payer: Self-pay | Admitting: Family Medicine

## 2017-09-01 NOTE — Telephone Encounter (Signed)
Patient of Dr Laurance Flatten. Please advise

## 2017-09-04 MED ORDER — ALPRAZOLAM 0.25 MG PO TABS: 0.2500 mg | ORAL_TABLET | Freq: Every day | ORAL | 0 refills | Status: DC | PRN

## 2017-09-04 NOTE — Telephone Encounter (Signed)
Patients wife aware

## 2017-09-04 NOTE — Telephone Encounter (Signed)
Covering for PCP  Refill xanax at different pharmacy, pt states that he has one that has expired and needs to transfer due to changing pharmqacies.    Laroy Apple, MD [facility] 09/04/2017, 7:40 AM

## 2017-11-22 ENCOUNTER — Ambulatory Visit (INDEPENDENT_AMBULATORY_CARE_PROVIDER_SITE_OTHER): Payer: 59 | Admitting: Family Medicine

## 2017-11-22 ENCOUNTER — Ambulatory Visit (INDEPENDENT_AMBULATORY_CARE_PROVIDER_SITE_OTHER): Payer: 59

## 2017-11-22 ENCOUNTER — Encounter: Payer: Self-pay | Admitting: Family Medicine

## 2017-11-22 VITALS — BP 152/87 | HR 87 | Temp 98.2°F | Ht 71.0 in | Wt 176.0 lb

## 2017-11-22 DIAGNOSIS — R059 Cough, unspecified: Secondary | ICD-10-CM

## 2017-11-22 DIAGNOSIS — N400 Enlarged prostate without lower urinary tract symptoms: Secondary | ICD-10-CM | POA: Diagnosis not present

## 2017-11-22 DIAGNOSIS — R05 Cough: Secondary | ICD-10-CM | POA: Diagnosis not present

## 2017-11-22 DIAGNOSIS — R030 Elevated blood-pressure reading, without diagnosis of hypertension: Secondary | ICD-10-CM

## 2017-11-22 DIAGNOSIS — Z23 Encounter for immunization: Secondary | ICD-10-CM | POA: Diagnosis not present

## 2017-11-22 DIAGNOSIS — Z0000 Encounter for general adult medical examination without abnormal findings: Secondary | ICD-10-CM

## 2017-11-22 DIAGNOSIS — M549 Dorsalgia, unspecified: Secondary | ICD-10-CM

## 2017-11-22 DIAGNOSIS — J301 Allergic rhinitis due to pollen: Secondary | ICD-10-CM

## 2017-11-22 NOTE — Patient Instructions (Addendum)
Continue current medications. Continue good therapeutic lifestyle changes which include good diet and exercise. Fall precautions discussed with patient. If an FOBT was given today- please return it to our front desk. If you are over 61 years old - you may need Prevnar 48 or the adult Pneumonia vaccine.  **Flu shots are available--- please call and schedule a FLU-CLINIC appointment**  After your visit with [place] today you will receive a survey in the mail or online from [employer] regarding your care with [place]. Please take a moment to fill this out. Your feedback is very important to [place] as you can help [place] better understand your patient needs as well as improve your experience and satisfaction. WE CARE ABOUT YOU!!!   Mucinex maximum strength plain, 1 twice daily with a large glass of water to help cough Use nasal saline frequently during the day Avoid the use of any overhead fans Drink plenty of water and fluids and stay well-hydrated Check blood pressures at home and bring readings by for review in a couple of weeks If the blood pressure remains elevated we may need to start you on medication to help control this.

## 2017-11-22 NOTE — Progress Notes (Signed)
Subjective:    Patient ID: Kevin Brady, male    DOB: 01-06-1956, 62 y.o.   MRN: [id number]  HPI Patient is here today for annual wellness exam and follow up of chronic medical problems which includes BPH. He is taking medication regularly.  The patient comes to the visit today for his physical exam.  He complains of having a cough for 3 weeks.  He complains of tightness between his shoulders and his back.  He will get a chest x-ray today lab work today including a PSA and a urinalysis.  He is also due to get a Tdap.  The patient had his last colonoscopy in 2008 and refuses to do another one.  His blood pressure is elevated on the systolic side today.  Patient currently takes Zyrtec for allergy alprazolam and Flonase for allergic rhinitis.  The patient says that his cough that he had had for 3 weeks seems to be getting better now and it is a dry cough.  He denies having any color to the sputum and did not has any fever.  He also has had some back pain associated with a cough.  He is doing better with this and thinks he is beginning to heal.  He denies any chest pain other than what he has had with the coughing.  He denies any shortness of breath which is now getting better other than what he has had with this cough and congestion.  He denies any nausea vomiting diarrhea blood in the stool black tarry bowel movements or change in his bowel habits.  He does not want to do anymore colonoscopies and we did talk about Cologuard and he may be interested in doing this.  He is passing his water without problems.  He is going to check some blood pressures at home since the initial blood pressure here was 149/90.    Patient Active Problem List   Diagnosis Date Noted  . Hyperlipidemia LDL goal <100 11/26/2016  . INTERNAL HEMORRHOIDS 09/11/2007  . ALLERGIC RHINITIS 09/11/2007  . GERD 09/11/2007  . GASTRITIS 09/11/2007  . FATIGUE 09/11/2007  . ABDOMINAL PAIN, EPIGASTRIC 09/11/2007   Outpatient Encounter  Medications as of 11/22/2017  Medication Sig  . ALPRAZolam (XANAX) 0.25 MG tablet Take 1 tablet (0.25 mg total) by mouth daily as needed.  . cetirizine (ZYRTEC) 10 MG tablet Take 1 tablet (10 mg total) by mouth daily.  . fluticasone (FLONASE) 50 MCG/ACT nasal spray Place 2 sprays into both nostrils daily.  . [DISCONTINUED] BELSOMRA 15 MG TABS Take 1 Tablet by mouth at bedtime as needed  . [DISCONTINUED] Vitamin D, Ergocalciferol, (DRISDOL) 50000 units CAPS capsule Take 1 capsule (50,000 Units total) by mouth every 7 (seven) days.   No facility-administered encounter medications on file as of 11/22/2017.       Review of Systems  Constitutional: Negative.   HENT: Negative.   Eyes: Negative.   Respiratory: Positive for cough (x 3 weeks ).   Cardiovascular: Negative.   Gastrointestinal: Negative.   Endocrine: Negative.   Genitourinary: Negative.   Musculoskeletal: Positive for arthralgias (pain between shoulder blades ).  Skin: Negative.   Allergic/Immunologic: Negative.   Neurological: Negative.   Hematological: Negative.   Psychiatric/Behavioral: Negative.        Objective:   Physical Exam  Constitutional: He is oriented to person, place, and time. He appears well-developed and well-nourished.  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  HENT:  Head: Normocephalic and atraumatic.  Right Ear: External ear normal.  Left Ear: External ear normal.  Mouth/Throat: Oropharynx is clear and moist. No oropharyngeal exudate.  Nasal turbinate congestion left greater than right  Eyes: Pupils are equal, round, and reactive to light. Conjunctivae and EOM are normal. Right eye exhibits no discharge. Left eye exhibits no discharge. No scleral icterus.  Neck: Normal range of motion. Neck supple. No thyromegaly present.  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  Is been there for a long time.  Cardiovascular: Normal rate, regular rhythm, normal heart sounds and intact distal pulses.  No murmur heard. Heart is slightly irregular at 72/min  Pulmonary/Chest: Effort normal and breath sounds normal. No respiratory distress. He has no wheezes. He has no rales. He exhibits no tenderness.  Dry cough.  Clear anteriorly and posteriorly.  No axillary adenopathy.  Abdominal: Soft. Bowel sounds are normal. He exhibits no mass. There is no tenderness.  No liver or spleen enlargement.  No masses.  No tenderness.  No bruits.  No inguinal adenopathy.  Genitourinary: Rectum normal and penis normal.  Genitourinary Comments: The prostate remains slightly enlarged without any lumps or masses.  The rectal had no masses.  External genitalia had normal testicular exam and no inguinal hernias or inguinal adenopathy.  Musculoskeletal: Normal range of motion. He exhibits no edema or tenderness.  Lymphadenopathy:    He has no cervical adenopathy.  Neurological: He is alert and oriented to person, place, and time. He has normal reflexes. No cranial nerve deficit.  Skin: Skin is warm and dry. No rash noted.  Psychiatric: He has a normal mood and affect. His behavior is normal. Judgment and thought content normal.  Normal behavior and affect.  Nursing note and vitals reviewed.   BP (!) 149/90 (BP Location: Left Arm)   Pulse 87   Temp 98.2 F (36.8 C) (Oral)   Ht _0  (1.803 m)   Wt 176 lb (79.8 kg)   BMI 24.55 kg/m    Chest x-ray with results pending    Assessment & Plan:  1. Annual physical exam -Consider Cologuard - Urinalysis, Complete - BMP8+EGFR; Future - CBC with Differential/Platelet; Future - Lipid panel; Future - VITAMIN D 25 Hydroxy (Vit-D Deficiency, Fractures); Future - Hepatic function panel; Future - PSA, total and free; Future  2. Benign prostatic hyperplasia, unspecified  whether lower urinary tract symptoms present -No complaints with voiding. - Urinalysis, Complete - CBC with Differential/Platelet; Future - PSA, total and free; Future  3. Cough -The patient says his cough is actually getting some better.  He will take some Mucinex maximum strength plain 1 twice daily for cough and congestion if he needs additional treatment for this. - DG Chest 2 View; Future - CBC with Differential/Platelet; Future  4. Mid back pain -Take Tylenol as needed for pain  5. Seasonal allergic rhinitis due to pollen -Use nasal saline -Patient was reminded that regular use of sedating antihistamines can increase the risk of dementia.  Zyrtec is a sedating antihistamine but if it is to scan to be taken for short period of time it will be fine.  He may want to try in the future some Claritin to see if this works.  Otherwise stay on Zyrtec as needed.  6. Elevated blood-pressure reading without diagnosis of hypertension -  Watch sodium intake.  Check blood pressure readings at home and bring these by for review with a repeat blood  Pressure by the nurse in 2-3 w by the nurse in 2 to 3 weeks  Patient Instructions  Continue current medications. Continue good therapeutic lifestyle changes which include good diet and exercise. Fall precautions discussed with patient. If an FOBT was given today- please return it to our front desk. If you are over 81 years old - you may need Prevnar 55 or the adult Pneumonia vaccine.  **Flu shots are available--- please call and schedule a FLU-CLINIC appointment**  After your visit with [place] today you will receive a survey in the mail or online from [employer] regarding your care with [place]. Please take a moment to fill this out. Your feedback is very important to [place] as you can help [place] better understand your patient needs as well as improve your experience and satisfaction. WE CARE ABOUT YOU!!!   Mucinex maximum strength plain, 1 twice daily with a large  glass of water to help cough Use nasal saline frequently during the day Avoid the use of any overhead fans Drink plenty of water and fluids and stay well-hydrated Check blood pressures at home and bring readings by for review in a couple of weeks If the blood pressure remains elevated we may need to start you on medication to help control this.   Arrie Senate MD

## 2017-11-23 ENCOUNTER — Other Ambulatory Visit: Payer: 59

## 2017-11-23 DIAGNOSIS — R05 Cough: Secondary | ICD-10-CM

## 2017-11-23 DIAGNOSIS — Z0000 Encounter for general adult medical examination without abnormal findings: Secondary | ICD-10-CM | POA: Diagnosis not present

## 2017-11-23 DIAGNOSIS — N400 Enlarged prostate without lower urinary tract symptoms: Secondary | ICD-10-CM

## 2017-11-23 DIAGNOSIS — R059 Cough, unspecified: Secondary | ICD-10-CM

## 2017-11-23 LAB — MICROSCOPIC EXAMINATION
CASTS: NONE SEEN /LPF
EPITHELIAL CELLS (NON RENAL): NONE SEEN /HPF (ref 0–10)

## 2017-11-23 LAB — URINALYSIS, COMPLETE
Bilirubin, UA: NEGATIVE
Glucose, UA: NEGATIVE
Ketones, UA: NEGATIVE
Leukocytes, UA: NEGATIVE
NITRITE UA: NEGATIVE
PH UA: 6 (ref 5.0–7.5)
Protein, UA: NEGATIVE
RBC, UA: NEGATIVE
Specific Gravity, UA: 1.011 (ref 1.005–1.030)
Urobilinogen, Ur: 0.2 mg/dL (ref 0.2–1.0)

## 2017-11-24 LAB — BMP8+EGFR
BUN/Creatinine Ratio: 11 (ref 10–24)
BUN: 10 mg/dL (ref 8–27)
CO2: 26 mmol/L (ref 20–29)
CREATININE: 0.89 mg/dL (ref 0.76–1.27)
Calcium: 9.1 mg/dL (ref 8.6–10.2)
Chloride: 103 mmol/L (ref 96–106)
GFR calc Af Amer: 107 mL/min/{1.73_m2} (ref >59)
GFR calc non Af Amer: 93 mL/min/{1.73_m2} (ref >59)
GLUCOSE: 90 mg/dL (ref 65–99)
Potassium: 4.9 mmol/L (ref 3.5–5.2)
SODIUM: 142 mmol/L (ref 134–144)

## 2017-11-24 LAB — PSA, TOTAL AND FREE
PSA FREE: 0.41 ng/mL
PSA, Free Pct: 34.2 %
Prostate Specific Ag, Serum: 1.2 ng/mL (ref 0.0–4.0)

## 2017-11-24 LAB — HEPATIC FUNCTION PANEL
ALK PHOS: 108 IU/L (ref 39–117)
ALT: 11 IU/L (ref 0–44)
AST: 21 IU/L (ref 0–40)
Albumin: 4.2 g/dL (ref 3.6–4.8)
Bilirubin Total: 0.4 mg/dL (ref 0.0–1.2)
Bilirubin, Direct: 0.11 mg/dL (ref 0.00–0.40)
Total Protein: 6.6 g/dL (ref 6.0–8.5)

## 2017-11-24 LAB — CBC WITH DIFFERENTIAL/PLATELET
BASOS ABS: 0.1 10*3/uL (ref 0.0–0.2)
BASOS: 1 %
EOS (ABSOLUTE): 0.1 10*3/uL (ref 0.0–0.4)
Eos: 3 %
HEMOGLOBIN: 15.7 g/dL (ref 13.0–17.7)
Hematocrit: 47 % (ref 37.5–51.0)
Immature Grans (Abs): 0 10*3/uL (ref 0.0–0.1)
Immature Granulocytes: 0 %
LYMPHS: 35 %
Lymphocytes Absolute: 1.9 10*3/uL (ref 0.7–3.1)
MCH: 30.8 pg (ref 26.6–33.0)
MCHC: 33.4 g/dL (ref 31.5–35.7)
MCV: 92 fL (ref 79–97)
Monocytes Absolute: 0.4 10*3/uL (ref 0.1–0.9)
Monocytes: 7 %
NEUTROS PCT: 54 %
Neutrophils Absolute: 2.8 10*3/uL (ref 1.4–7.0)
Platelets: 314 10*3/uL (ref 150–450)
RBC: 5.1 x10E6/uL (ref 4.14–5.80)
RDW: 13.8 % (ref 12.3–15.4)
WBC: 5.3 10*3/uL (ref 3.4–10.8)

## 2017-11-24 LAB — LIPID PANEL
Chol/HDL Ratio: 4.2 ratio (ref 0.0–5.0)
Cholesterol, Total: 202 mg/dL — ABNORMAL HIGH (ref 100–199)
HDL: 48 mg/dL (ref >39)
LDL Calculated: 137 mg/dL — ABNORMAL HIGH (ref 0–99)
TRIGLYCERIDES: 83 mg/dL (ref 0–149)
VLDL Cholesterol Cal: 17 mg/dL (ref 5–40)

## 2017-11-24 LAB — VITAMIN D 25 HYDROXY (VIT D DEFICIENCY, FRACTURES): Vit D, 25-Hydroxy: 31.6 ng/mL (ref 30.0–100.0)

## 2017-11-25 LAB — FECAL OCCULT BLOOD, IMMUNOCHEMICAL: Fecal Occult Bld: NEGATIVE

## 2017-11-30 ENCOUNTER — Other Ambulatory Visit: Payer: Self-pay | Admitting: Family Medicine

## 2017-12-13 ENCOUNTER — Encounter: Payer: Self-pay | Admitting: *Deleted

## 2018-01-01 ENCOUNTER — Other Ambulatory Visit: Payer: Self-pay | Admitting: *Deleted

## 2018-01-01 MED ORDER — METHOCARBAMOL 750 MG PO TABS: 750.0000 mg | ORAL_TABLET | Freq: Three times a day (TID) | ORAL | 0 refills | Status: DC | PRN

## 2018-01-01 MED ORDER — SUVOREXANT 15 MG PO TABS: 1.0000 | ORAL_TABLET | Freq: Every evening | ORAL | 2 refills | Status: DC | PRN

## 2018-01-22 DIAGNOSIS — D485 Neoplasm of uncertain behavior of skin: Secondary | ICD-10-CM | POA: Diagnosis not present

## 2018-01-22 DIAGNOSIS — L821 Other seborrheic keratosis: Secondary | ICD-10-CM | POA: Diagnosis not present

## 2018-04-18 ENCOUNTER — Other Ambulatory Visit: Payer: Self-pay | Admitting: *Deleted

## 2018-04-18 MED ORDER — FLUTICASONE PROPIONATE 50 MCG/ACT NA SUSP: 2.0000 | Freq: Every day | NASAL | 11 refills | Status: DC

## 2018-04-18 MED ORDER — ALPRAZOLAM 0.25 MG PO TABS: 0.2500 mg | ORAL_TABLET | Freq: Every day | ORAL | 5 refills | Status: DC | PRN

## 2018-05-29 ENCOUNTER — Other Ambulatory Visit: Payer: Self-pay | Admitting: Family Medicine

## 2018-10-29 ENCOUNTER — Other Ambulatory Visit: Payer: Self-pay | Admitting: *Deleted

## 2018-10-30 MED ORDER — ALPRAZOLAM 0.25 MG PO TABS: 0.2500 mg | ORAL_TABLET | Freq: Every day | ORAL | 4 refills | Status: DC | PRN

## 2018-11-28 ENCOUNTER — Ambulatory Visit: Payer: 59 | Admitting: Family Medicine

## 2018-11-30 ENCOUNTER — Other Ambulatory Visit: Payer: Self-pay | Admitting: *Deleted

## 2018-11-30 MED ORDER — CYCLOBENZAPRINE HCL 10 MG PO TABS: 10.0000 mg | ORAL_TABLET | Freq: Three times a day (TID) | ORAL | 1 refills | Status: DC | PRN

## 2019-01-10 ENCOUNTER — Telehealth: Payer: Self-pay | Admitting: Family Medicine

## 2019-01-23 ENCOUNTER — Ambulatory Visit: Payer: 59 | Admitting: Family Medicine

## 2019-03-06 IMAGING — DX DG CHEST 2V
3 series · 3 of 3 positions shown · non-contrast
Comparison: November 20, 2015

CLINICAL DATA: Cough

EXAM:
CHEST - 2 VIEW

[chest pa (1 of 2)]
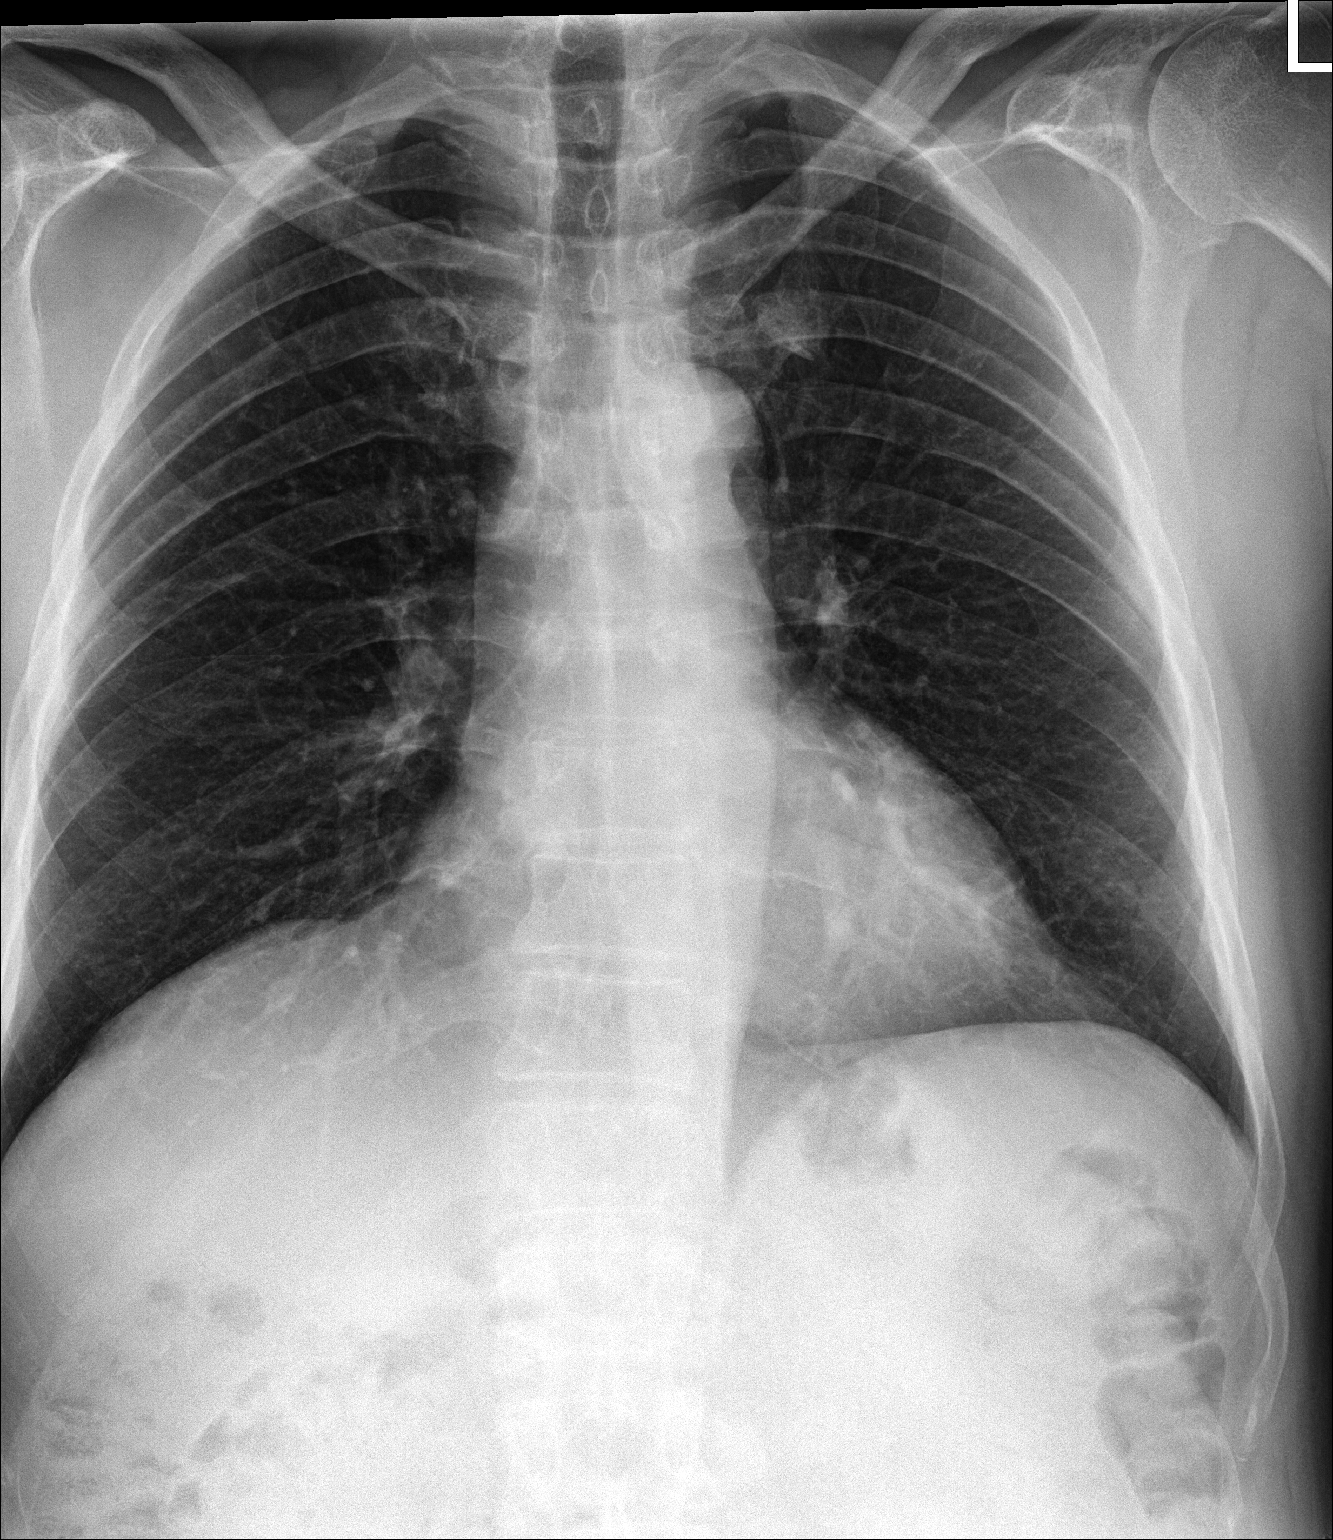

[chest lat]
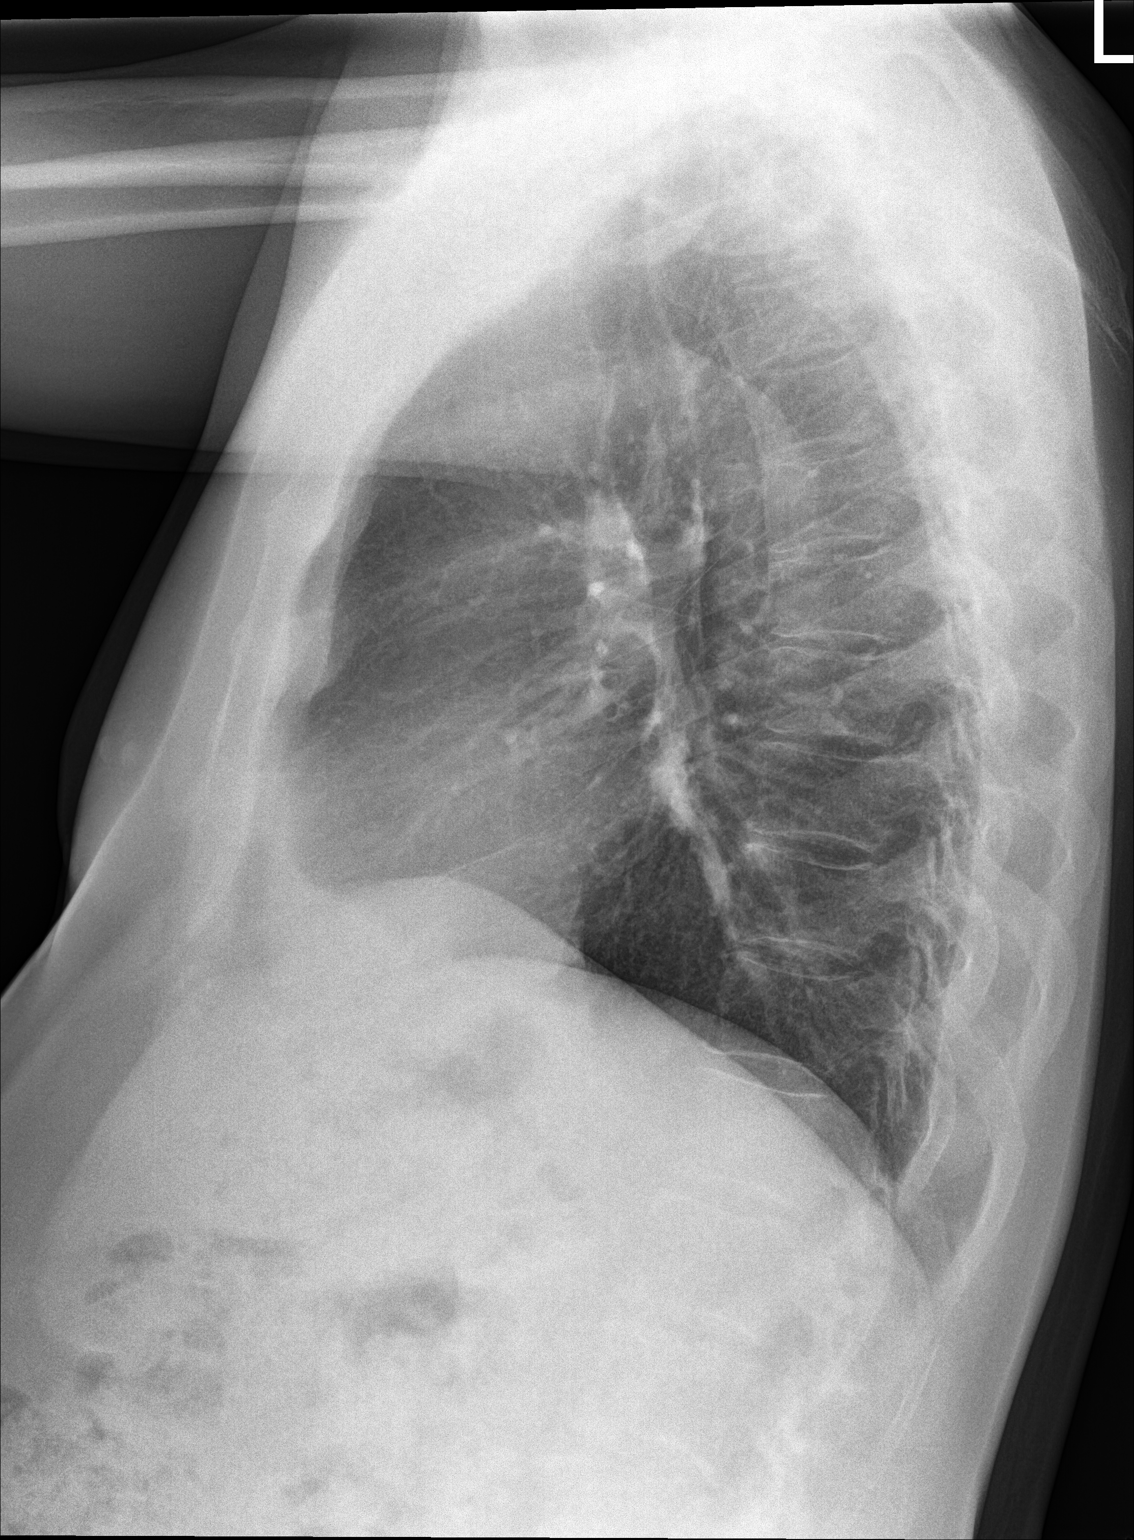

[chest pa (2 of 2)]
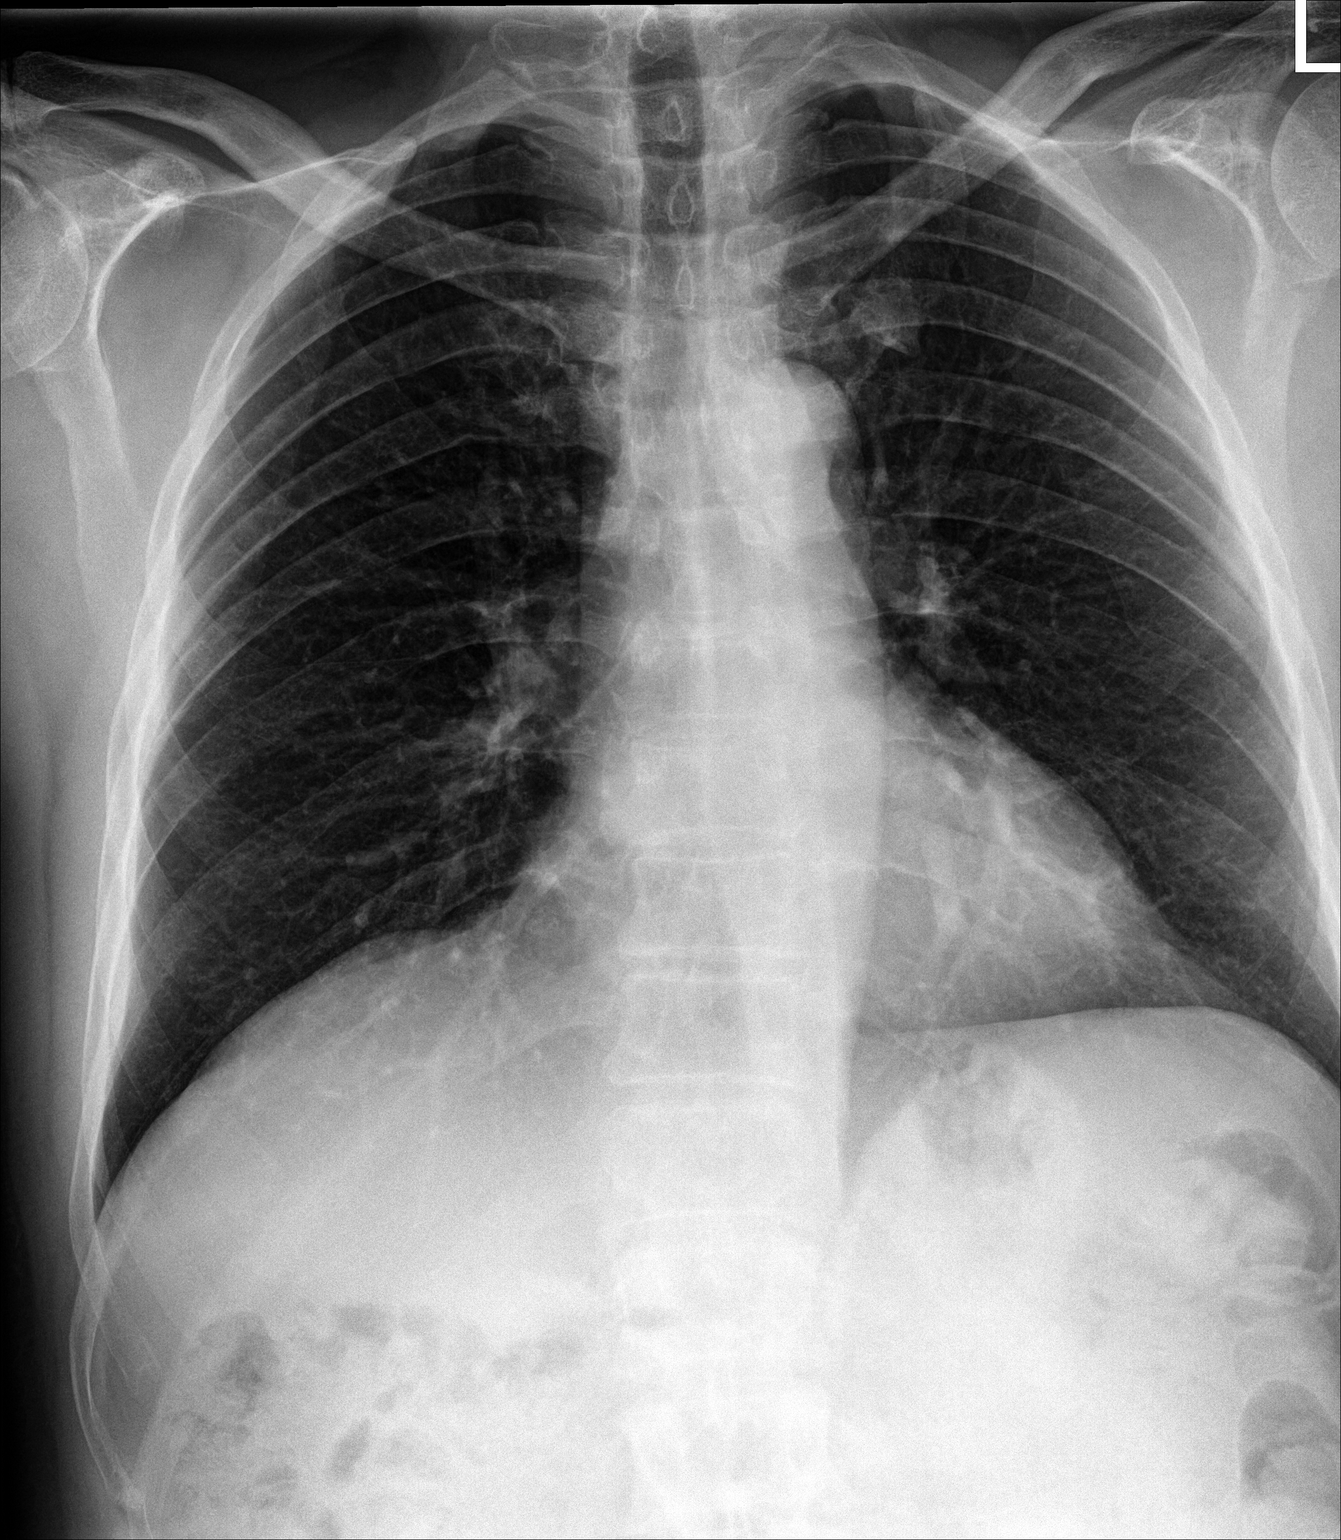

[3 of 3 positions shown; findings below may reference images not displayed]

FINDINGS: There is no edema or consolidation. The heart size and pulmonary
vascularity are normal. No adenopathy. No evident bone lesions.
IMPRESSION: No edema or consolidation.

## 2019-03-30 ENCOUNTER — Other Ambulatory Visit: Payer: Self-pay | Admitting: Family Medicine

## 2021-02-15 ENCOUNTER — Telehealth: Payer: Self-pay | Admitting: Orthopaedic Surgery

## 2021-02-15 NOTE — Telephone Encounter (Signed)
Patient called to request appointment with Dr Kevin Brady for problem of cyst on his neck, which he relays he has had for about 2 years; said it had not bothered him until recently. Relays he has not been seen for it; said does not have a primary care doctor since Dr Redge Gainer retired.  Discussed with patient that Dr Kevin Brady no longer performs surgery, and also that we will request that our practice administrator will review, and advise, as our clinic may not be the specialist office that he would best be suited. Appointment pending review; patient aware.

## 2021-02-16 NOTE — Telephone Encounter (Signed)
Called back to patient; offered appointment.

## 2021-02-18 ENCOUNTER — Ambulatory Visit: Payer: 59 | Admitting: Orthopaedic Surgery

## 2021-02-18 ENCOUNTER — Other Ambulatory Visit: Payer: Self-pay

## 2021-02-18 ENCOUNTER — Encounter: Payer: Self-pay | Admitting: Orthopaedic Surgery

## 2021-02-18 VITALS — BP 180/116 | HR 85 | Ht 70.0 in | Wt 176.4 lb

## 2021-02-18 DIAGNOSIS — Z7689 Persons encountering health services in other specified circumstances: Secondary | ICD-10-CM | POA: Diagnosis not present

## 2021-02-18 DIAGNOSIS — D170 Benign lipomatous neoplasm of skin and subcutaneous tissue of head, face and neck: Secondary | ICD-10-CM

## 2021-02-18 NOTE — Progress Notes (Signed)
Subjective:    Patient ID: Kevin Brady, male    DOB: [birth year], 65 y.o.   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  He had seen a general surgeon about this in 2014.  It is getting bigger and hurting him more.  He does not have a family doctor.  I had seen the patient years ago and he made appointment here as he knew I would refer him to whomever he needed.  He has no redness, no paresthesias.     Review of Systems  Constitutional:  Positive for activity change.  Musculoskeletal:  Positive for neck pain.  All other systems reviewed and are negative. For Review of Systems, all other systems reviewed and are negative.  The following is a summary of the past history medically, past history surgically, known current medicines, social history and family history.  This information is gathered electronically by the computer from prior information and documentation.  I review this each visit and have found including this information at this point in the chart is beneficial and informative.   Past Medical History:  Diagnosis Date   Arthritis     History reviewed. No pertinent surgical history.  Current Outpatient Medications on File Prior to Visit  Medication Sig Dispense Refill   ALPRAZolam (XANAX) 0.25 MG tablet Take 1 tablet (0.25 mg total) by mouth daily as needed. 30 tablet 4   cetirizine (ZYRTEC) 10 MG tablet Take 1 tablet (10 mg total) by mouth daily. 30 tablet 6   cyclobenzaprine (FLEXERIL) 10 MG tablet TAKE 1 TABLET THREE TIMES DAILY AS NEEDED FOR MUSCLE SPASM 30 tablet 0   fluticasone (FLONASE) 50 MCG/ACT nasal spray Place 2 sprays into both nostrils daily. 16 g 11   methocarbamol (ROBAXIN) 750 MG tablet TAKE 1 TABLET THREE TIMES DAILY AS NEEDED FOR MUSCLE SPASM 90 tablet 0   No current facility-administered medications on file prior to visit.    Social History   Socioeconomic History   Marital status: Married    Spouse name: Not on file    Number of children: Not on file   Years of education: Not on file   Highest education level: Not on file  Occupational History   Not on file  Tobacco Use   Smoking status: Former    Packs/day: 0.50    Types: Cigarettes    Start date: 03/05/1975    Quit date: 03/04/1977    Years since quitting: 43.9   Smokeless tobacco: Never  Vaping Use   Vaping Use: Never used  Substance and Sexual Activity   Alcohol use: Yes    Comment: rare   Drug use: No   Sexual activity: Not on file  Other Topics Concern   Not on file  Social History Narrative   Not on file   Social Determinants of Health   Financial Resource Strain: Not on file  Food Insecurity: Not on file  Transportation Needs: Not on file  Physical Activity: Not on file  Stress: Not on file  Social Connections: Not on file  Intimate Partner Violence: Not on file    Family History  Problem Relation Age of Onset   Stroke Mother    Mental illness Mother    Diabetes Mother    Heart disease Father    Hypertension Father     BP (!) 180/116   Pulse 85   Ht '5\' 10"'$  (1.778 m)   Wt 176 lb 6.4 oz (  80 kg)   BMI 25.31 kg/m   Body mass index is 25.31 kg/m.     Objective:   Physical Exam Vitals and nursing note reviewed. Exam conducted with a chaperone present.  Constitutional:      Appearance: He is well-developed.  HENT:     Head: Normocephalic and atraumatic.  Eyes:     Conjunctiva/sclera: Conjunctivae normal.     Pupils: Pupils are equal, round, and reactive to light.  Cardiovascular:     Rate and Rhythm: Normal rate and regular rhythm.  Pulmonary:     Effort: Pulmonary effort is normal.  Abdominal:     Palpations: Abdomen is soft.  Musculoskeletal:     Cervical back: Normal range of motion and neck supple.       Back:  Skin:    General: Skin is warm and dry.  Neurological:     Mental Status: He is alert and oriented to person, place, and time.     Cranial Nerves: No cranial nerve deficit.     Motor: No  abnormal muscle tone.     Coordination: Coordination normal.     Deep Tendon Reflexes: Reflexes are normal and symmetric. Reflexes normal.  Psychiatric:        Behavior: Behavior normal.        Thought Content: Thought content normal.        Judgment: Judgment normal.          Assessment & Plan:   Encounter Diagnosis  Name Primary?   Lipoma of neck Yes   I will have him see general surgeon for consideration of removal of the lipoma.  He needs to get a new family doctor.  His doctor has retired.  Call if any problem.  Precautions discussed.  Electronically Signed Sanjuana Kava, MD 8/18/202210:03 AM

## 2021-02-18 NOTE — Addendum Note (Signed)
Addended by: Brand Males E on: 02/18/2021 11:04 AM   Modules accepted: Orders

## 2021-02-22 ENCOUNTER — Telehealth: Payer: Self-pay

## 2021-02-22 NOTE — Telephone Encounter (Signed)
Spoke with patient's wife.  He recently saw an orthopedic doctor and his blood pressure was elevated at the visit.  Wife has since been taking BP at home and this morning it was 195/120.  Patient was last seen in office here in May 2019.  A new patient appointment was scheduled tomorrow at 2:50 pm with Monia Pouch.  Patient was advised that this was a very elevated blood pressure and they should consider going on to the urgent care today.  Wife will continue to monitor.

## 2021-02-23 ENCOUNTER — Ambulatory Visit: Payer: 59 | Admitting: Family Medicine

## 2021-02-23 ENCOUNTER — Encounter: Payer: Self-pay | Admitting: Family Medicine

## 2021-02-23 ENCOUNTER — Other Ambulatory Visit: Payer: Self-pay

## 2021-02-23 VITALS — BP 158/90 | HR 87 | Temp 98.0°F | Resp 20 | Ht 70.0 in | Wt 176.0 lb

## 2021-02-23 DIAGNOSIS — K219 Gastro-esophageal reflux disease without esophagitis: Secondary | ICD-10-CM | POA: Diagnosis not present

## 2021-02-23 DIAGNOSIS — F411 Generalized anxiety disorder: Secondary | ICD-10-CM | POA: Diagnosis not present

## 2021-02-23 DIAGNOSIS — E785 Hyperlipidemia, unspecified: Secondary | ICD-10-CM

## 2021-02-23 DIAGNOSIS — E559 Vitamin D deficiency, unspecified: Secondary | ICD-10-CM | POA: Insufficient documentation

## 2021-02-23 DIAGNOSIS — I10 Essential (primary) hypertension: Secondary | ICD-10-CM | POA: Diagnosis not present

## 2021-02-23 DIAGNOSIS — R5383 Other fatigue: Secondary | ICD-10-CM

## 2021-02-23 MED ORDER — LISINOPRIL 5 MG PO TABS: 5.0000 mg | ORAL_TABLET | Freq: Every day | ORAL | 3 refills | Status: DC

## 2021-02-23 MED ORDER — FLUOXETINE HCL 20 MG PO TABS: 20.0000 mg | ORAL_TABLET | Freq: Every evening | ORAL | 3 refills | Status: DC

## 2021-02-23 NOTE — Patient Instructions (Signed)
DASH Eating Plan DASH stands for "Dietary Approaches to Stop Hypertension." The DASH eating plan is a healthy eating plan that has been shown to reduce high blood pressure (hypertension). Additional health benefits may include reducing the risk of type 2 diabetes mellitus, heart disease, and stroke. The DASH eating plan may also help with weight loss.  WHAT DO I NEED TO KNOW ABOUT THE DASH EATING PLAN? For the DASH eating plan, you will follow these general guidelines:  Choose foods with a percent daily value for sodium of less than 5% (as listed on the food label).  Use salt-free seasonings or herbs instead of table salt or sea salt.  Check with your health care provider or pharmacist before using salt substitutes.  Eat lower-sodium products, often labeled as "lower sodium" or "no salt added."  Eat fresh foods.  Eat more vegetables, fruits, and low-fat dairy products.  Choose whole grains. Look for the word "whole" as the first word in the ingredient list.  Choose fish and skinless chicken or turkey more often than red meat. Limit fish, poultry, and meat to 6 oz (170 g) each day.  Limit sweets, desserts, sugars, and sugary drinks.  Choose heart-healthy fats.  Limit cheese to 1 oz (28 g) per day.  Eat more home-cooked food and less restaurant, buffet, and fast food.  Limit fried foods.  Cook foods using methods other than frying.  Limit canned vegetables. If you do use them, rinse them well to decrease the sodium.  When eating at a restaurant, ask that your food be prepared with less salt, or no salt if possible.  WHAT FOODS CAN I EAT? Seek help from a dietitian for individual calorie needs.  Grains Whole grain or whole wheat bread. Brown rice. Whole grain or whole wheat pasta. Quinoa, bulgur, and whole grain cereals. Low-sodium cereals. Corn or whole wheat flour tortillas. Whole grain cornbread. Whole grain crackers. Low-sodium crackers.  Vegetables Fresh or frozen  vegetables (raw, steamed, roasted, or grilled). Low-sodium or reduced-sodium tomato and vegetable juices. Low-sodium or reduced-sodium tomato sauce and paste. Low-sodium or reduced-sodium canned vegetables.   Fruits All fresh, canned (in natural juice), or frozen fruits.  Meat and Other Protein Products Ground beef (85% or leaner), grass-fed beef, or beef trimmed of fat. Skinless chicken or turkey. Ground chicken or turkey. Pork trimmed of fat. All fish and seafood. Eggs. Dried beans, peas, or lentils. Unsalted nuts and seeds. Unsalted canned beans.  Dairy Low-fat dairy products, such as skim or 1% milk, 2% or reduced-fat cheeses, low-fat ricotta or cottage cheese, or plain low-fat yogurt. Low-sodium or reduced-sodium cheeses.  Fats and Oils Tub margarines without trans fats. Light or reduced-fat mayonnaise and salad dressings (reduced sodium). Avocado. Safflower, olive, or canola oils. Natural peanut or almond butter.  Other Unsalted popcorn and pretzels. The items listed above may not be a complete list of recommended foods or beverages. Contact your dietitian for more options.  WHAT FOODS ARE NOT RECOMMENDED?  Grains White bread. White pasta. White rice. Refined cornbread. Bagels and croissants. Crackers that contain trans fat.  Vegetables Creamed or fried vegetables. Vegetables in a cheese sauce. Regular canned vegetables. Regular canned tomato sauce and paste. Regular tomato and vegetable juices.  Fruits Dried fruits. Canned fruit in light or heavy syrup. Fruit juice.  Meat and Other Protein Products Fatty cuts of meat. Ribs, chicken wings, bacon, sausage, bologna, salami, chitterlings, fatback, hot dogs, bratwurst, and packaged luncheon meats. Salted nuts and seeds. Canned beans with salt.    Dairy Whole or 2% milk, cream, half-and-half, and cream cheese. Whole-fat or sweetened yogurt. Full-fat cheeses or blue cheese. Nondairy creamers and whipped toppings. Processed cheese,  cheese spreads, or cheese curds.  Condiments Onion and garlic salt, seasoned salt, table salt, and sea salt. Canned and packaged gravies. Worcestershire sauce. Tartar sauce. Barbecue sauce. Teriyaki sauce. Soy sauce, including reduced sodium. Steak sauce. Fish sauce. Oyster sauce. Cocktail sauce. Horseradish. Ketchup and mustard. Meat flavorings and tenderizers. Bouillon cubes. Hot sauce. Tabasco sauce. Marinades. Taco seasonings. Relishes.  Fats and Oils Butter, stick margarine, lard, shortening, ghee, and bacon fat. Coconut, palm kernel, or palm oils. Regular salad dressings.  Other Pickles and olives. Salted popcorn and pretzels.  The items listed above may not be a complete list of foods and beverages to avoid. Contact your dietitian for more information.  WHERE CAN I FIND MORE INFORMATION? National Heart, Lung, and [employer] Institute: www.nhlbi.nih.gov/health/health-topics/topics/dash/ Document Released: 06/09/2011 Document Revised: 11/04/2013 Document Reviewed: 04/24/2013 Exit[employer] Patient Information 2015 [employer]. This information is not intended to replace advice given to you by your health care provider. Make sure you discuss any questions you have with your health care provider.   I think that you would greatly benefit from seeing a nutritionist.  If you are interested, please call Dr Sykes at [phone] to schedule an appointment.   

## 2021-02-23 NOTE — Progress Notes (Signed)
Subjective:  Patient ID: Kevin Brady, male    DOB: 23-Jul-1955, 65 y.o.   MRN: [id number]  Patient Care Team: Baruch Gouty, FNP as PCP - General (Family Medicine)   Chief Complaint:  Establish Care and Hypertension (BP elevated at [facility] )   HPI: Kevin Brady is a 65 y.o. male presenting on 02/23/2021 for Establish Care and Hypertension (BP elevated at [facility] )   Pt presents today for evaluation of elevated blood pressure and anxiety. States he has not seen a PCP since Dr. Laurance Flatten retired. He was seen by ortho recently and told he needed to see a PCP as his BP was very elevated. He states he has notice some slight dizziness at times along with a headache. No focal deficits. No chest pain, near syncope, or syncope. He reports anxiety is worse since retiring. States he worries constantly. He was on Xanax in the past but no SSRI or SNRI therapy. States mother is on fluoxetine and it works very well for her. No SI or HI.   Hypertension This is a new problem. The current episode started more than 1 month ago. The problem has been waxing and waning since onset. The problem is uncontrolled. Associated symptoms include anxiety, headaches and malaise/fatigue. Pertinent negatives include no blurred vision, chest pain, neck pain, orthopnea, palpitations, peripheral edema, PND, shortness of breath or sweats. Risk factors for coronary artery disease include male gender, sedentary lifestyle, family history and dyslipidemia. Past treatments include nothing.  Anxiety Presents for initial visit. Onset was 6 to 12 months ago. The problem has been waxing and waning. Symptoms include compulsions, dizziness, excessive worry, irritability, malaise, nervous/anxious behavior and obsessions. Patient reports no chest pain, confusion, decreased concentration, depressed mood, dry mouth, feeling of choking, hyperventilation, impotence, insomnia, muscle tension, nausea, palpitations, panic, restlessness, shortness of  breath or suicidal ideas. Symptoms occur most days. The severity of symptoms is moderate and interfering with daily activities. The symptoms are aggravated by family issues and social activities. The quality of sleep is good. Nighttime awakenings: occasional.   Risk factors include family history. Past treatments include benzodiazephines. The treatment provided moderate relief.    [place] Office Visit from 02/23/2021 in [place]  PHQ-9 Total Score 3      GAD 7 : Generalized Anxiety Score 02/23/2021  Nervous, Anxious, on Edge 2  Control/stop worrying 2  Worry too much - different things 2  Trouble relaxing 2  Restless 0  Easily annoyed or irritable 1  Afraid - awful might happen 1  Total GAD 7 Score 10  Anxiety Difficulty Somewhat difficult      Relevant past medical, surgical, family, and social history reviewed and updated as indicated.  Allergies and medications reviewed and updated. Data reviewed: Chart in Epic.   Past Medical History:  Diagnosis Date   Arthritis     History reviewed. No pertinent surgical history.  Social History   Socioeconomic History   Marital status: Married    Spouse name: Not on file   Number of children: Not on file   Years of education: Not on file   Highest education level: Not on file  Occupational History   Not on file  Tobacco Use   Smoking status: Former    Packs/day: 0.50    Types: Cigarettes    Start date: 03/05/1975    Quit date: 03/04/1977    Years since quitting: 44.0   Smokeless tobacco: Never  Vaping Use  Vaping Use: Never used  Substance and Sexual Activity   Alcohol use: Yes    Comment: rare   Drug use: No   Sexual activity: Not on file  Other Topics Concern   Not on file  Social History Narrative   Not on file   Social Determinants of Health   Financial Resource Strain: Not on file  Food Insecurity: Not on file  Transportation Needs: Not on file  Physical Activity: Not on file   Stress: Not on file  Social Connections: Not on file  Intimate Partner Violence: Not on file    Outpatient Encounter Medications as of 02/23/2021  Medication Sig   cetirizine (ZYRTEC) 10 MG tablet Take 1 tablet (10 mg total) by mouth daily.   FLUoxetine (PROZAC) 20 MG tablet Take 1 tablet (20 mg total) by mouth at bedtime.   fluticasone (FLONASE) 50 MCG/ACT nasal spray Place 2 sprays into both nostrils daily.   lisinopril (ZESTRIL) 5 MG tablet Take 1 tablet (5 mg total) by mouth daily.   [DISCONTINUED] ALPRAZolam (XANAX) 0.25 MG tablet Take 1 tablet (0.25 mg total) by mouth daily as needed. (Patient not taking: Reported on 02/23/2021)   [DISCONTINUED] cyclobenzaprine (FLEXERIL) 10 MG tablet TAKE 1 TABLET THREE TIMES DAILY AS NEEDED FOR MUSCLE SPASM   [DISCONTINUED] methocarbamol (ROBAXIN) 750 MG tablet TAKE 1 TABLET THREE TIMES DAILY AS NEEDED FOR MUSCLE SPASM (Patient not taking: Reported on 02/23/2021)   No facility-administered encounter medications on file as of 02/23/2021.    Allergies  Allergen Reactions   Penicillins Rash   Xyzal [Levocetirizine Dihydrochloride]     Fatigue    Nexium [Esomeprazole Magnesium] Other (See Comments)    constipation   Paxil [Paroxetine Hcl] Other (See Comments)    fatigue    Review of Systems  Constitutional:  Positive for irritability and malaise/fatigue. Negative for activity change, appetite change, chills, diaphoresis, fatigue, fever and unexpected weight change.  HENT: Negative.    Eyes: Negative.  Negative for blurred vision.  Respiratory:  Negative for cough, chest tightness and shortness of breath.   Cardiovascular:  Negative for chest pain, palpitations, orthopnea, leg swelling and PND.  Gastrointestinal:  Negative for abdominal distention, abdominal pain, anal bleeding, blood in stool, constipation, diarrhea, nausea, rectal pain and vomiting.  Endocrine: Negative.  Negative for cold intolerance, heat intolerance, polydipsia, polyphagia  and polyuria.  Genitourinary:  Negative for decreased urine volume, difficulty urinating, dysuria, frequency, impotence and urgency.  Musculoskeletal:  Negative for arthralgias, myalgias and neck pain.  Skin: Negative.   Allergic/Immunologic: Negative.   Neurological:  Positive for dizziness and headaches. Negative for tremors, seizures, syncope, facial asymmetry, speech difficulty, weakness, light-headedness and numbness.  Hematological: Negative.   Psychiatric/Behavioral:  Positive for agitation and dysphoric mood. Negative for behavioral problems, confusion, decreased concentration, hallucinations, self-injury, sleep disturbance and suicidal ideas. The patient is nervous/anxious. The patient does not have insomnia and is not hyperactive.   All other systems reviewed and are negative.      Objective:  BP (!) 158/90   Pulse 87   Temp 98 F (36.7 C)   Resp 20   Ht _0  (1.778 m)   Wt 176 lb (79.8 kg)   SpO2 98%   BMI 25.25 kg/m    Wt Readings from Last 3 Encounters:  02/23/21 176 lb (79.8 kg)  02/18/21 176 lb 6.4 oz (80 kg)  11/22/17 176 lb (79.8 kg)    Physical Exam Vitals and nursing note reviewed.  Constitutional:  General: He is not in acute distress.    Appearance: Normal appearance. He is well-developed, well-groomed and overweight. He is not ill-appearing, toxic-appearing or diaphoretic.  HENT:     Head: Normocephalic and atraumatic.     Jaw: There is normal jaw occlusion.     Right Ear: Hearing, tympanic membrane, ear canal and external ear normal.     Left Ear: Hearing, tympanic membrane, ear canal and external ear normal.     Nose: Nose normal.     Mouth/Throat:     Lips: Pink.     Mouth: Mucous membranes are moist.     Pharynx: Oropharynx is clear. Uvula midline.  Eyes:     General: Lids are normal.     Extraocular Movements: Extraocular movements intact.     Conjunctiva/sclera: Conjunctivae normal.     Pupils: Pupils are equal, round, and reactive to  light.  Neck:     Thyroid: No thyroid mass, thyromegaly or thyroid tenderness.     Vascular: No carotid bruit or JVD.     Trachea: Trachea and phonation normal.  Cardiovascular:     Rate and Rhythm: Normal rate and regular rhythm.     Chest Wall: PMI is not displaced.     Pulses: Normal pulses.     Heart sounds: Normal heart sounds. No murmur heard.   No friction rub. No gallop.  Pulmonary:     Effort: Pulmonary effort is normal. No respiratory distress.     Breath sounds: Normal breath sounds. No stridor. No wheezing, rhonchi or rales.  Chest:     Chest wall: No tenderness.  Abdominal:     General: Bowel sounds are normal. There is no distension or abdominal bruit.     Palpations: Abdomen is soft. There is no hepatomegaly or splenomegaly.     Tenderness: There is no abdominal tenderness. There is no right CVA tenderness or left CVA tenderness.     Hernia: No hernia is present.  Musculoskeletal:        General: Normal range of motion.     Cervical back: Normal range of motion and neck supple.     Right lower leg: No edema.     Left lower leg: No edema.  Lymphadenopathy:     Cervical: No cervical adenopathy.  Skin:    General: Skin is warm and dry.     Capillary Refill: Capillary refill takes less than 2 seconds.     Coloration: Skin is not cyanotic, jaundiced or pale.     Findings: No rash.  Neurological:     General: No focal deficit present.     Mental Status: He is alert and oriented to person, place, and time.     Cranial Nerves: Cranial nerves are intact. No cranial nerve deficit.     Sensory: Sensation is intact. No sensory deficit.     Motor: Motor function is intact. No weakness.     Coordination: Coordination is intact. Coordination normal.     Gait: Gait is intact. Gait normal.     Deep Tendon Reflexes: Reflexes are normal and symmetric. Reflexes normal.  Psychiatric:        Attention and Perception: Attention and perception normal.        Mood and Affect: Affect  normal. Mood is anxious.        Speech: Speech normal.        Behavior: Behavior normal. Behavior is cooperative.        Thought Content: Thought content normal.  Cognition and Memory: Cognition and memory normal.        Judgment: Judgment normal.    Results for orders placed or performed in visit on 11/23/17  Fecal occult blood, imunochemical   Specimen: Stool   ST  Result Value Ref Range   Fecal Occult Bld Negative Negative  PSA, total and free  Result Value Ref Range   Prostate Specific Ag, Serum 1.2 0.0 - 4.0 ng/mL   PSA, Free 0.41 N/A ng/mL   PSA, Free Pct 34.2 %  Hepatic function panel  Result Value Ref Range   Total Protein 6.6 6.0 - 8.5 g/dL   Albumin 4.2 3.6 - 4.8 g/dL   Bilirubin Total 0.4 0.0 - 1.2 mg/dL   Bilirubin, Direct 0.11 0.00 - 0.40 mg/dL   Alkaline Phosphatase 108 39 - 117 IU/L   AST 21 0 - 40 IU/L   ALT 11 0 - 44 IU/L  VITAMIN D 25 Hydroxy (Vit-D Deficiency, Fractures)  Result Value Ref Range   Vit D, 25-Hydroxy 31.6 30.0 - 100.0 ng/mL  Lipid panel  Result Value Ref Range   Cholesterol, Total 202 (H) 100 - 199 mg/dL   Triglycerides 83 0 - 149 mg/dL   HDL 48 >39 mg/dL   VLDL Cholesterol Cal 17 5 - 40 mg/dL   LDL Calculated 137 (H) 0 - 99 mg/dL   Chol/HDL Ratio 4.2 0.0 - 5.0 ratio  CBC with Differential/Platelet  Result Value Ref Range   WBC 5.3 3.4 - 10.8 x10E3/uL   RBC 5.10 4.14 - 5.80 x10E6/uL   Hemoglobin 15.7 13.0 - 17.7 g/dL   Hematocrit 47.0 37.5 - 51.0 %   MCV 92 79 - 97 fL   MCH 30.8 26.6 - 33.0 pg   MCHC 33.4 31.5 - 35.7 g/dL   RDW 13.8 12.3 - 15.4 %   Platelets 314 150 - 450 x10E3/uL   Neutrophils 54 Not Estab. %   Lymphs 35 Not Estab. %   Monocytes 7 Not Estab. %   Eos 3 Not Estab. %   Basos 1 Not Estab. %   Neutrophils Absolute 2.8 1.4 - 7.0 x10E3/uL   Lymphocytes Absolute 1.9 0.7 - 3.1 x10E3/uL   Monocytes Absolute 0.4 0.1 - 0.9 x10E3/uL   EOS (ABSOLUTE) 0.1 0.0 - 0.4 x10E3/uL   Basophils Absolute 0.1 0.0 - 0.2 x10E3/uL    Immature Granulocytes 0 Not Estab. %   Immature Grans (Abs) 0.0 0.0 - 0.1 x10E3/uL  BMP8+EGFR  Result Value Ref Range   Glucose 90 65 - 99 mg/dL   BUN 10 8 - 27 mg/dL   Creatinine, Ser 0.89 0.76 - 1.27 mg/dL   GFR calc non Af Amer 93 >59 mL/min/1.73   GFR calc Af Amer 107 >59 mL/min/1.73   BUN/Creatinine Ratio 11 10 - 24   Sodium 142 134 - 144 mmol/L   Potassium 4.9 3.5 - 5.2 mmol/L   Chloride 103 96 - 106 mmol/L   CO2 26 20 - 29 mmol/L   Calcium 9.1 8.6 - 10.2 mg/dL     EKG in office: SR 81, no acute ST-T changes or ectopy, QT 362 ms, PR 146 ms.   Pertinent labs & imaging results that were available during my care of the patient were reviewed by me and considered in my medical decision making.  Assessment & Plan:  Rani was seen today for establish care and hypertension.  Diagnoses and all orders for this visit:  Primary hypertension DASH diet and exercise encouraged. EKG  without hypertensive changes. Will check below labs for possible underlying causes. Will initiate lisinopril today. Monitor BP and report any persistent high or low readings.  -     CBC with Differential/Platelet -     CMP14+EGFR -     Lipid panel -     Thyroid Panel With TSH -     lisinopril (ZESTRIL) 5 MG tablet; Take 1 tablet (5 mg total) by mouth daily. -     EKG 12-Lead  GAD (generalized anxiety disorder) Ongoing and worsening since retirement. Will check below labs for possible underlying causes. Start fluoxetine as prescribed. Report any new or worsening symptoms.  -     CBC with Differential/Platelet -     CMP14+EGFR -     Thyroid Panel With TSH -     VITAMIN D 25 Hydroxy (Vit-D Deficiency, Fractures) -     FLUoxetine (PROZAC) 20 MG tablet; Take 1 tablet (20 mg total) by mouth at bedtime.  Hyperlipidemia LDL goal <100 Currently not on medications. Will check lipids today.  -     Lipid panel  Gastroesophageal reflux disease without esophagitis Well controlled, no red flags present. Will  check CBC.  -     CBC with Differential/Platelet  Fatigue, unspecified type Ongoing. Could be due to underlying anxiety. Will check below labs.  -     CBC with Differential/Platelet -     CMP14+EGFR -     Thyroid Panel With TSH -     VITAMIN D 25 Hydroxy (Vit-D Deficiency, Fractures)  Vitamin D deficiency On repletion therapy. Will check level today.  -     VITAMIN D 25 Hydroxy (Vit-D Deficiency, Fractures)    Continue all other maintenance medications.  Follow up plan: Return in about 2 weeks (around 03/09/2021), or if symptoms worsen or fail to improve, for BP, BMP.   Continue healthy lifestyle choices, including diet (rich in fruits, vegetables, and lean proteins, and low in salt and simple carbohydrates) and exercise (at least 30 minutes of moderate physical activity daily).  Educational handout given for DASH diet  The above assessment and management plan was discussed with the patient. The patient verbalized understanding of and has agreed to the management plan. Patient is aware to call the clinic if they develop any new symptoms or if symptoms persist or worsen. Patient is aware when to return to the clinic for a follow-up visit. Patient educated on when it is appropriate to go to the emergency department.   Monia Pouch, FNP-C [facility] [phone]

## 2021-02-27 LAB — THYROID PANEL WITH TSH
Free Thyroxine Index: 2 (ref 1.2–4.9)
T3 Uptake Ratio: 26 % (ref 24–39)
T4, Total: 7.5 ug/dL (ref 4.5–12.0)
TSH: 2.96 u[IU]/mL (ref 0.450–4.500)

## 2021-02-27 LAB — CBC WITH DIFFERENTIAL/PLATELET
Basophils Absolute: 0.1 10*3/uL (ref 0.0–0.2)
Basos: 1 %
EOS (ABSOLUTE): 0 10*3/uL (ref 0.0–0.4)
Eos: 1 %
Hematocrit: 44.9 % (ref 37.5–51.0)
Hemoglobin: 15.3 g/dL (ref 13.0–17.7)
Immature Grans (Abs): 0 10*3/uL (ref 0.0–0.1)
Immature Granulocytes: 0 %
Lymphocytes Absolute: 1.5 10*3/uL (ref 0.7–3.1)
Lymphs: 24 %
MCH: 30.5 pg (ref 26.6–33.0)
MCHC: 34.1 g/dL (ref 31.5–35.7)
MCV: 90 fL (ref 79–97)
Monocytes Absolute: 0.4 10*3/uL (ref 0.1–0.9)
Monocytes: 6 %
Neutrophils Absolute: 4.2 10*3/uL (ref 1.4–7.0)
Neutrophils: 68 %
Platelets: 283 10*3/uL (ref 150–450)
RBC: 5.01 x10E6/uL (ref 4.14–5.80)
RDW: 12.6 % (ref 11.6–15.4)
WBC: 6.1 10*3/uL (ref 3.4–10.8)

## 2021-02-27 LAB — CMP14+EGFR
ALT: 15 IU/L (ref 0–44)
AST: 22 IU/L (ref 0–40)
Albumin/Globulin Ratio: 2.2 (ref 1.2–2.2)
Albumin: 4.4 g/dL (ref 3.8–4.8)
Alkaline Phosphatase: 140 IU/L — ABNORMAL HIGH (ref 44–121)
BUN/Creatinine Ratio: 7 — ABNORMAL LOW (ref 10–24)
BUN: 8 mg/dL (ref 8–27)
Bilirubin Total: 0.2 mg/dL (ref 0.0–1.2)
CO2: 20 mmol/L (ref 20–29)
Calcium: 9.1 mg/dL (ref 8.6–10.2)
Chloride: 100 mmol/L (ref 96–106)
Creatinine, Ser: 1.07 mg/dL (ref 0.76–1.27)
Globulin, Total: 2 g/dL (ref 1.5–4.5)
Glucose: 118 mg/dL — ABNORMAL HIGH (ref 65–99)
Potassium: 4.1 mmol/L (ref 3.5–5.2)
Sodium: 139 mmol/L (ref 134–144)
Total Protein: 6.4 g/dL (ref 6.0–8.5)
eGFR: 78 mL/min/{1.73_m2} (ref >59)

## 2021-02-27 LAB — LIPID PANEL
Chol/HDL Ratio: 4.7 ratio (ref 0.0–5.0)
Cholesterol, Total: 178 mg/dL (ref 100–199)
HDL: 38 mg/dL — ABNORMAL LOW (ref >39)
LDL Chol Calc (NIH): 121 mg/dL — ABNORMAL HIGH (ref 0–99)
Triglycerides: 103 mg/dL (ref 0–149)
VLDL Cholesterol Cal: 19 mg/dL (ref 5–40)

## 2021-02-27 LAB — VITAMIN D 25 HYDROXY (VIT D DEFICIENCY, FRACTURES): Vit D, 25-Hydroxy: 44.9 ng/mL (ref 30.0–100.0)

## 2021-03-04 ENCOUNTER — Other Ambulatory Visit: Payer: Self-pay

## 2021-03-04 ENCOUNTER — Ambulatory Visit: Payer: 59 | Admitting: Family Medicine

## 2021-03-04 ENCOUNTER — Encounter: Payer: Self-pay | Admitting: Family Medicine

## 2021-03-04 VITALS — BP 156/95 | HR 87 | Temp 98.0°F | Ht 70.0 in | Wt 175.0 lb

## 2021-03-04 DIAGNOSIS — I10 Essential (primary) hypertension: Secondary | ICD-10-CM | POA: Diagnosis not present

## 2021-03-04 DIAGNOSIS — J301 Allergic rhinitis due to pollen: Secondary | ICD-10-CM | POA: Diagnosis not present

## 2021-03-04 LAB — BMP8+EGFR
BUN/Creatinine Ratio: 12 (ref 10–24)
BUN: 11 mg/dL (ref 8–27)
CO2: 22 mmol/L (ref 20–29)
Calcium: 9.1 mg/dL (ref 8.6–10.2)
Chloride: 100 mmol/L (ref 96–106)
Creatinine, Ser: 0.92 mg/dL (ref 0.76–1.27)
Glucose: 128 mg/dL — ABNORMAL HIGH (ref 65–99)
Potassium: 4.1 mmol/L (ref 3.5–5.2)
Sodium: 137 mmol/L (ref 134–144)
eGFR: 93 mL/min/{1.73_m2} (ref >59)

## 2021-03-04 MED ORDER — LISINOPRIL 10 MG PO TABS: 10.0000 mg | ORAL_TABLET | Freq: Every day | ORAL | 3 refills | Status: DC

## 2021-03-04 MED ORDER — CETIRIZINE HCL 10 MG PO TABS: 10.0000 mg | ORAL_TABLET | Freq: Every day | ORAL | 6 refills | Status: DC

## 2021-03-04 MED ORDER — FLUTICASONE PROPIONATE 50 MCG/ACT NA SUSP: 2.0000 | Freq: Every day | NASAL | 11 refills | Status: DC

## 2021-03-04 NOTE — Patient Instructions (Signed)
DASH Eating Plan DASH stands for "Dietary Approaches to Stop Hypertension." The DASH eating plan is a healthy eating plan that has been shown to reduce high blood pressure (hypertension). Additional health benefits may include reducing the risk of type 2 diabetes mellitus, heart disease, and stroke. The DASH eating plan may also help with weight loss.  WHAT DO I NEED TO KNOW ABOUT THE DASH EATING PLAN? For the DASH eating plan, you will follow these general guidelines:  Choose foods with a percent daily value for sodium of less than 5% (as listed on the food label).  Use salt-free seasonings or herbs instead of table salt or sea salt.  Check with your health care provider or pharmacist before using salt substitutes.  Eat lower-sodium products, often labeled as "lower sodium" or "no salt added."  Eat fresh foods.  Eat more vegetables, fruits, and low-fat dairy products.  Choose whole grains. Look for the word "whole" as the first word in the ingredient list.  Choose fish and skinless chicken or turkey more often than red meat. Limit fish, poultry, and meat to 6 oz (170 g) each day.  Limit sweets, desserts, sugars, and sugary drinks.  Choose heart-healthy fats.  Limit cheese to 1 oz (28 g) per day.  Eat more home-cooked food and less restaurant, buffet, and fast food.  Limit fried foods.  Cook foods using methods other than frying.  Limit canned vegetables. If you do use them, rinse them well to decrease the sodium.  When eating at a restaurant, ask that your food be prepared with less salt, or no salt if possible.  WHAT FOODS CAN I EAT? Seek help from a dietitian for individual calorie needs.  Grains Whole grain or whole wheat bread. Brown rice. Whole grain or whole wheat pasta. Quinoa, bulgur, and whole grain cereals. Low-sodium cereals. Corn or whole wheat flour tortillas. Whole grain cornbread. Whole grain crackers. Low-sodium crackers.  Vegetables Fresh or frozen  vegetables (raw, steamed, roasted, or grilled). Low-sodium or reduced-sodium tomato and vegetable juices. Low-sodium or reduced-sodium tomato sauce and paste. Low-sodium or reduced-sodium canned vegetables.   Fruits All fresh, canned (in natural juice), or frozen fruits.  Meat and Other Protein Products Ground beef (85% or leaner), grass-fed beef, or beef trimmed of fat. Skinless chicken or turkey. Ground chicken or turkey. Pork trimmed of fat. All fish and seafood. Eggs. Dried beans, peas, or lentils. Unsalted nuts and seeds. Unsalted canned beans.  Dairy Low-fat dairy products, such as skim or 1% milk, 2% or reduced-fat cheeses, low-fat ricotta or cottage cheese, or plain low-fat yogurt. Low-sodium or reduced-sodium cheeses.  Fats and Oils Tub margarines without trans fats. Light or reduced-fat mayonnaise and salad dressings (reduced sodium). Avocado. Safflower, olive, or canola oils. Natural peanut or almond butter.  Other Unsalted popcorn and pretzels. The items listed above may not be a complete list of recommended foods or beverages. Contact your dietitian for more options.  WHAT FOODS ARE NOT RECOMMENDED?  Grains White bread. White pasta. White rice. Refined cornbread. Bagels and croissants. Crackers that contain trans fat.  Vegetables Creamed or fried vegetables. Vegetables in a cheese sauce. Regular canned vegetables. Regular canned tomato sauce and paste. Regular tomato and vegetable juices.  Fruits Dried fruits. Canned fruit in light or heavy syrup. Fruit juice.  Meat and Other Protein Products Fatty cuts of meat. Ribs, chicken wings, bacon, sausage, bologna, salami, chitterlings, fatback, hot dogs, bratwurst, and packaged luncheon meats. Salted nuts and seeds. Canned beans with salt.    Dairy Whole or 2% milk, cream, half-and-half, and cream cheese. Whole-fat or sweetened yogurt. Full-fat cheeses or blue cheese. Nondairy creamers and whipped toppings. Processed cheese,  cheese spreads, or cheese curds.  Condiments Onion and garlic salt, seasoned salt, table salt, and sea salt. Canned and packaged gravies. Worcestershire sauce. Tartar sauce. Barbecue sauce. Teriyaki sauce. Soy sauce, including reduced sodium. Steak sauce. Fish sauce. Oyster sauce. Cocktail sauce. Horseradish. Ketchup and mustard. Meat flavorings and tenderizers. Bouillon cubes. Hot sauce. Tabasco sauce. Marinades. Taco seasonings. Relishes.  Fats and Oils Butter, stick margarine, lard, shortening, ghee, and bacon fat. Coconut, palm kernel, or palm oils. Regular salad dressings.  Other Pickles and olives. Salted popcorn and pretzels.  The items listed above may not be a complete list of foods and beverages to avoid. Contact your dietitian for more information.  WHERE CAN I FIND MORE INFORMATION? National Heart, Lung, and [employer] Institute: www.nhlbi.nih.gov/health/health-topics/topics/dash/ Document Released: 06/09/2011 Document Revised: 11/04/2013 Document Reviewed: 04/24/2013 Exit[employer] Patient Information 2015 [employer]. This information is not intended to replace advice given to you by your health care provider. Make sure you discuss any questions you have with your health care provider.   I think that you would greatly benefit from seeing a nutritionist.  If you are interested, please call Dr Sykes at [phone] to schedule an appointment.   

## 2021-03-04 NOTE — Progress Notes (Signed)
Subjective:  Patient ID: Kevin Brady, male    DOB: 07/18/1955, 65 y.o.   MRN: [id number]  Patient Care Team: Baruch Gouty, FNP as PCP - General (Family Medicine)   Chief Complaint:  Hypertension   HPI: Kevin Brady is a 65 y.o. male presenting on 03/04/2021 for Hypertension   Pt presents today for blood pressure recheck. He was started on low dose lisinopril at prior visit. He has been taking medications as prescribed without adverse side effects. Headaches have resolved. He would also like a refill of his Flonase and Zyrtec. He has seasonal allergies and reports increased sneezing and rhinorrhea over the last few days. He is out of his medications for rhinitis. He has used in the past with great relief of symptoms.   Hypertension This is a new problem. The current episode started more than 1 month ago. The problem has been gradually improving since onset. The problem is uncontrolled. Associated symptoms include anxiety. Pertinent negatives include no blurred vision, chest pain, headaches, malaise/fatigue, neck pain, orthopnea, palpitations, peripheral edema, PND, shortness of breath or sweats. Past treatments include ACE inhibitors. The current treatment provides mild improvement. Compliance problems include exercise and diet.    Relevant past medical, surgical, family, and social history reviewed and updated as indicated.  Allergies and medications reviewed and updated. Data reviewed: Chart in Epic.   Past Medical History:  Diagnosis Date   Arthritis     History reviewed. No pertinent surgical history.  Social History   Socioeconomic History   Marital status: Married    Spouse name: Not on file   Number of children: Not on file   Years of education: Not on file   Highest education level: Not on file  Occupational History   Not on file  Tobacco Use   Smoking status: Former    Packs/day: 0.50    Types: Cigarettes    Start date: 03/05/1975    Quit date: 03/04/1977     Years since quitting: 44.0   Smokeless tobacco: Never  Vaping Use   Vaping Use: Never used  Substance and Sexual Activity   Alcohol use: Yes    Comment: rare   Drug use: No   Sexual activity: Not on file  Other Topics Concern   Not on file  Social History Narrative   Not on file   Social Determinants of Health   Financial Resource Strain: Not on file  Food Insecurity: Not on file  Transportation Needs: Not on file  Physical Activity: Not on file  Stress: Not on file  Social Connections: Not on file  Intimate Partner Violence: Not on file    Outpatient Encounter Medications as of 03/04/2021  Medication Sig   FLUoxetine (PROZAC) 20 MG tablet Take 1 tablet (20 mg total) by mouth at bedtime.   lisinopril (ZESTRIL) 10 MG tablet Take 1 tablet (10 mg total) by mouth daily.   [DISCONTINUED] cetirizine (ZYRTEC) 10 MG tablet Take 1 tablet (10 mg total) by mouth daily.   [DISCONTINUED] fluticasone (FLONASE) 50 MCG/ACT nasal spray Place 2 sprays into both nostrils daily.   [DISCONTINUED] lisinopril (ZESTRIL) 5 MG tablet Take 1 tablet (5 mg total) by mouth daily.   cetirizine (ZYRTEC) 10 MG tablet Take 1 tablet (10 mg total) by mouth daily.   fluticasone (FLONASE) 50 MCG/ACT nasal spray Place 2 sprays into both nostrils daily.   No facility-administered encounter medications on file as of 03/04/2021.    Allergies  Allergen Reactions  Penicillins Rash   Xyzal [Levocetirizine Dihydrochloride]     Fatigue    Nexium [Esomeprazole Magnesium] Other (See Comments)    constipation   Paxil [Paroxetine Hcl] Other (See Comments)    fatigue    Review of Systems  Constitutional:  Negative for activity change, appetite change, chills, diaphoresis, fatigue, fever, malaise/fatigue and unexpected weight change.  HENT:  Positive for postnasal drip, rhinorrhea and sneezing. Negative for congestion, dental problem, drooling, ear discharge, ear pain, facial swelling, hearing loss, mouth sores,  nosebleeds, sore throat, tinnitus, trouble swallowing and voice change.   Eyes: Negative.  Negative for blurred vision.  Respiratory:  Negative for cough, chest tightness and shortness of breath.   Cardiovascular:  Negative for chest pain, palpitations, orthopnea, leg swelling and PND.  Gastrointestinal:  Negative for abdominal pain, blood in stool, constipation, diarrhea, nausea and vomiting.  Endocrine: Negative.   Genitourinary:  Negative for decreased urine volume, difficulty urinating, dysuria, frequency and urgency.  Musculoskeletal:  Negative for arthralgias, myalgias and neck pain.  Skin: Negative.   Allergic/Immunologic: Negative.   Neurological:  Negative for dizziness, tremors, seizures, syncope, facial asymmetry, speech difficulty, weakness, light-headedness, numbness and headaches.  Hematological: Negative.   Psychiatric/Behavioral:  Positive for dysphoric mood. Negative for agitation, behavioral problems, confusion, hallucinations, self-injury, sleep disturbance and suicidal ideas. The patient is nervous/anxious. The patient is not hyperactive.   All other systems reviewed and are negative.      Objective:  BP (!) 156/95   Pulse 87   Temp 98 F (36.7 C) (Temporal)   Ht '5\' 10"'  (1.778 m)   Wt 175 lb (79.4 kg)   SpO2 97%   BMI 25.11 kg/m    Wt Readings from Last 3 Encounters:  03/04/21 175 lb (79.4 kg)  02/23/21 176 lb (79.8 kg)  02/18/21 176 lb 6.4 oz (80 kg)    Physical Exam Vitals and nursing note reviewed.  Constitutional:      General: He is not in acute distress.    Appearance: Normal appearance. He is well-developed, well-groomed and normal weight. He is not ill-appearing, toxic-appearing or diaphoretic.  HENT:     Head: Normocephalic and atraumatic.     Jaw: There is normal jaw occlusion.     Right Ear: Hearing normal.     Left Ear: Hearing normal.     Nose: Nose normal.     Mouth/Throat:     Lips: Pink.     Mouth: Mucous membranes are moist.      Pharynx: Oropharynx is clear. Uvula midline.  Eyes:     General: Lids are normal.     Extraocular Movements: Extraocular movements intact.     Conjunctiva/sclera: Conjunctivae normal.     Pupils: Pupils are equal, round, and reactive to light.  Neck:     Thyroid: No thyroid mass, thyromegaly or thyroid tenderness.     Vascular: No carotid bruit or JVD.     Trachea: Trachea and phonation normal.  Cardiovascular:     Rate and Rhythm: Normal rate and regular rhythm.     Chest Wall: PMI is not displaced.     Pulses: Normal pulses.     Heart sounds: Normal heart sounds. No murmur heard.   No friction rub. No gallop.  Pulmonary:     Effort: Pulmonary effort is normal. No respiratory distress.     Breath sounds: Normal breath sounds. No wheezing.  Abdominal:     General: Bowel sounds are normal. There is no distension or abdominal bruit.  Palpations: Abdomen is soft. There is no hepatomegaly or splenomegaly.     Tenderness: There is no abdominal tenderness. There is no right CVA tenderness or left CVA tenderness.     Hernia: No hernia is present.  Musculoskeletal:        General: Normal range of motion.     Cervical back: Normal range of motion and neck supple.     Right lower leg: No edema.     Left lower leg: No edema.  Lymphadenopathy:     Cervical: No cervical adenopathy.  Skin:    General: Skin is warm and dry.     Capillary Refill: Capillary refill takes less than 2 seconds.     Coloration: Skin is not cyanotic, jaundiced or pale.     Findings: No rash.  Neurological:     General: No focal deficit present.     Mental Status: He is alert and oriented to person, place, and time.     Cranial Nerves: Cranial nerves are intact. No cranial nerve deficit.     Sensory: Sensation is intact. No sensory deficit.     Motor: Motor function is intact. No weakness.     Coordination: Coordination is intact. Coordination normal.     Gait: Gait is intact. Gait normal.     Deep Tendon  Reflexes: Reflexes are normal and symmetric. Reflexes normal.  Psychiatric:        Attention and Perception: Attention and perception normal.        Mood and Affect: Affect normal. Mood is anxious.        Speech: Speech normal.        Behavior: Behavior normal. Behavior is cooperative.        Thought Content: Thought content normal.        Cognition and Memory: Cognition and memory normal.        Judgment: Judgment normal.    Results for orders placed or performed in visit on 02/23/21  CBC with Differential/Platelet  Result Value Ref Range   WBC 6.1 3.4 - 10.8 x10E3/uL   RBC 5.01 4.14 - 5.80 x10E6/uL   Hemoglobin 15.3 13.0 - 17.7 g/dL   Hematocrit 44.9 37.5 - 51.0 %   MCV 90 79 - 97 fL   MCH 30.5 26.6 - 33.0 pg   MCHC 34.1 31.5 - 35.7 g/dL   RDW 12.6 11.6 - 15.4 %   Platelets 283 150 - 450 x10E3/uL   Neutrophils 68 Not Estab. %   Lymphs 24 Not Estab. %   Monocytes 6 Not Estab. %   Eos 1 Not Estab. %   Basos 1 Not Estab. %   Neutrophils Absolute 4.2 1.4 - 7.0 x10E3/uL   Lymphocytes Absolute 1.5 0.7 - 3.1 x10E3/uL   Monocytes Absolute 0.4 0.1 - 0.9 x10E3/uL   EOS (ABSOLUTE) 0.0 0.0 - 0.4 x10E3/uL   Basophils Absolute 0.1 0.0 - 0.2 x10E3/uL   Immature Granulocytes 0 Not Estab. %   Immature Grans (Abs) 0.0 0.0 - 0.1 x10E3/uL  CMP14+EGFR  Result Value Ref Range   Glucose 118 (H) 65 - 99 mg/dL   BUN 8 8 - 27 mg/dL   Creatinine, Ser 1.07 0.76 - 1.27 mg/dL   eGFR 78 >59 mL/min/1.73   BUN/Creatinine Ratio 7 (L) 10 - 24   Sodium 139 134 - 144 mmol/L   Potassium 4.1 3.5 - 5.2 mmol/L   Chloride 100 96 - 106 mmol/L   CO2 20 20 - 29 mmol/L   Calcium  9.1 8.6 - 10.2 mg/dL   Total Protein 6.4 6.0 - 8.5 g/dL   Albumin 4.4 3.8 - 4.8 g/dL   Globulin, Total 2.0 1.5 - 4.5 g/dL   Albumin/Globulin Ratio 2.2 1.2 - 2.2   Bilirubin Total 0.2 0.0 - 1.2 mg/dL   Alkaline Phosphatase 140 (H) 44 - 121 IU/L   AST 22 0 - 40 IU/L   ALT 15 0 - 44 IU/L  Lipid panel  Result Value Ref Range    Cholesterol, Total 178 100 - 199 mg/dL   Triglycerides 103 0 - 149 mg/dL   HDL 38 (L) >39 mg/dL   VLDL Cholesterol Cal 19 5 - 40 mg/dL   LDL Chol Calc (NIH) 121 (H) 0 - 99 mg/dL   Chol/HDL Ratio 4.7 0.0 - 5.0 ratio  Thyroid Panel With TSH  Result Value Ref Range   TSH 2.960 0.450 - 4.500 uIU/mL   T4, Total 7.5 4.5 - 12.0 ug/dL   T3 Uptake Ratio 26 24 - 39 %   Free Thyroxine Index 2.0 1.2 - 4.9  VITAMIN D 25 Hydroxy (Vit-D Deficiency, Fractures)  Result Value Ref Range   Vit D, 25-Hydroxy 44.9 30.0 - 100.0 ng/mL       Pertinent labs & imaging results that were available during my care of the patient were reviewed by me and considered in my medical decision making.  Assessment & Plan:  Grigor was seen today for hypertension.  Diagnoses and all orders for this visit:  Primary hypertension Still not at goal. BMP drawn today. DASH diet and exercise encouraged. Will increase lisinopril to 10 mg, may need to increase to 20 mg if not at [facility] in next 4-6 weeks. Continue to monitor and report high or low readings.  -     BMP8+EGFR -     lisinopril (ZESTRIL) 10 MG tablet; Take 1 tablet (10 mg total) by mouth daily.  Seasonal allergic rhinitis due to pollen Well controlled with below. Will continue.  -     cetirizine (ZYRTEC) 10 MG tablet; Take 1 tablet (10 mg total) by mouth daily. -     fluticasone (FLONASE) 50 MCG/ACT nasal spray; Place 2 sprays into both nostrils daily.     Continue all other maintenance medications.  Follow up plan: Return in about 6 weeks (around 04/15/2021), or if symptoms worsen or fail to improve, for BP.   Continue healthy lifestyle choices, including diet (rich in fruits, vegetables, and lean proteins, and low in salt and simple carbohydrates) and exercise (at least 30 minutes of moderate physical activity daily).  Educational handout given for DASH diet  The above assessment and management plan was discussed with the patient. The patient verbalized  understanding of and has agreed to the management plan. Patient is aware to call the clinic if they develop any new symptoms or if symptoms persist or worsen. Patient is aware when to return to the clinic for a follow-up visit. Patient educated on when it is appropriate to go to the emergency department.   Monia Pouch, FNP-C [facility] [phone]

## 2021-04-15 ENCOUNTER — Ambulatory Visit: Payer: 59 | Admitting: Family Medicine

## 2021-04-15 ENCOUNTER — Other Ambulatory Visit: Payer: Self-pay

## 2021-04-15 ENCOUNTER — Encounter: Payer: Self-pay | Admitting: Family Medicine

## 2021-04-15 VITALS — BP 143/81 | HR 84 | Ht 71.0 in | Wt 180.8 lb

## 2021-04-15 DIAGNOSIS — I10 Essential (primary) hypertension: Secondary | ICD-10-CM | POA: Diagnosis not present

## 2021-04-15 DIAGNOSIS — F411 Generalized anxiety disorder: Secondary | ICD-10-CM | POA: Diagnosis not present

## 2021-04-15 MED ORDER — FLUOXETINE HCL 40 MG PO CAPS: 40.0000 mg | ORAL_CAPSULE | Freq: Every day | ORAL | 3 refills | Status: DC

## 2021-04-15 MED ORDER — LISINOPRIL 20 MG PO TABS: 20.0000 mg | ORAL_TABLET | Freq: Every day | ORAL | 3 refills | Status: DC

## 2021-04-15 NOTE — Patient Instructions (Signed)
DASH Eating Plan DASH stands for "Dietary Approaches to Stop Hypertension." The DASH eating plan is a healthy eating plan that has been shown to reduce high blood pressure (hypertension). Additional health benefits may include reducing the risk of type 2 diabetes mellitus, heart disease, and stroke. The DASH eating plan may also help with weight loss.  WHAT DO I NEED TO KNOW ABOUT THE DASH EATING PLAN? For the DASH eating plan, you will follow these general guidelines:  Choose foods with a percent daily value for sodium of less than 5% (as listed on the food label).  Use salt-free seasonings or herbs instead of table salt or sea salt.  Check with your health care provider or pharmacist before using salt substitutes.  Eat lower-sodium products, often labeled as "lower sodium" or "no salt added."  Eat fresh foods.  Eat more vegetables, fruits, and low-fat dairy products.  Choose whole grains. Look for the word "whole" as the first word in the ingredient list.  Choose fish and skinless chicken or turkey more often than red meat. Limit fish, poultry, and meat to 6 oz (170 g) each day.  Limit sweets, desserts, sugars, and sugary drinks.  Choose heart-healthy fats.  Limit cheese to 1 oz (28 g) per day.  Eat more home-cooked food and less restaurant, buffet, and fast food.  Limit fried foods.  Cook foods using methods other than frying.  Limit canned vegetables. If you do use them, rinse them well to decrease the sodium.  When eating at a restaurant, ask that your food be prepared with less salt, or no salt if possible.  WHAT FOODS CAN I EAT? Seek help from a dietitian for individual calorie needs.  Grains Whole grain or whole wheat bread. Brown rice. Whole grain or whole wheat pasta. Quinoa, bulgur, and whole grain cereals. Low-sodium cereals. Corn or whole wheat flour tortillas. Whole grain cornbread. Whole grain crackers. Low-sodium crackers.  Vegetables Fresh or frozen  vegetables (raw, steamed, roasted, or grilled). Low-sodium or reduced-sodium tomato and vegetable juices. Low-sodium or reduced-sodium tomato sauce and paste. Low-sodium or reduced-sodium canned vegetables.   Fruits All fresh, canned (in natural juice), or frozen fruits.  Meat and Other Protein Products Ground beef (85% or leaner), grass-fed beef, or beef trimmed of fat. Skinless chicken or turkey. Ground chicken or turkey. Pork trimmed of fat. All fish and seafood. Eggs. Dried beans, peas, or lentils. Unsalted nuts and seeds. Unsalted canned beans.  Dairy Low-fat dairy products, such as skim or 1% milk, 2% or reduced-fat cheeses, low-fat ricotta or cottage cheese, or plain low-fat yogurt. Low-sodium or reduced-sodium cheeses.  Fats and Oils Tub margarines without trans fats. Light or reduced-fat mayonnaise and salad dressings (reduced sodium). Avocado. Safflower, olive, or canola oils. Natural peanut or almond butter.  Other Unsalted popcorn and pretzels. The items listed above may not be a complete list of recommended foods or beverages. Contact your dietitian for more options.  WHAT FOODS ARE NOT RECOMMENDED?  Grains White bread. White pasta. White rice. Refined cornbread. Bagels and croissants. Crackers that contain trans fat.  Vegetables Creamed or fried vegetables. Vegetables in a cheese sauce. Regular canned vegetables. Regular canned tomato sauce and paste. Regular tomato and vegetable juices.  Fruits Dried fruits. Canned fruit in light or heavy syrup. Fruit juice.  Meat and Other Protein Products Fatty cuts of meat. Ribs, chicken wings, bacon, sausage, bologna, salami, chitterlings, fatback, hot dogs, bratwurst, and packaged luncheon meats. Salted nuts and seeds. Canned beans with salt.    Dairy Whole or 2% milk, cream, half-and-half, and cream cheese. Whole-fat or sweetened yogurt. Full-fat cheeses or blue cheese. Nondairy creamers and whipped toppings. Processed cheese,  cheese spreads, or cheese curds.  Condiments Onion and garlic salt, seasoned salt, table salt, and sea salt. Canned and packaged gravies. Worcestershire sauce. Tartar sauce. Barbecue sauce. Teriyaki sauce. Soy sauce, including reduced sodium. Steak sauce. Fish sauce. Oyster sauce. Cocktail sauce. Horseradish. Ketchup and mustard. Meat flavorings and tenderizers. Bouillon cubes. Hot sauce. Tabasco sauce. Marinades. Taco seasonings. Relishes.  Fats and Oils Butter, stick margarine, lard, shortening, ghee, and bacon fat. Coconut, palm kernel, or palm oils. Regular salad dressings.  Other Pickles and olives. Salted popcorn and pretzels.  The items listed above may not be a complete list of foods and beverages to avoid. Contact your dietitian for more information.  WHERE CAN I FIND MORE INFORMATION? National Heart, Lung, and [employer] Institute: www.nhlbi.nih.gov/health/health-topics/topics/dash/ Document Released: 06/09/2011 Document Revised: 11/04/2013 Document Reviewed: 04/24/2013 Exit[employer] Patient Information 2015 [employer]. This information is not intended to replace advice given to you by your health care provider. Make sure you discuss any questions you have with your health care provider.   I think that you would greatly benefit from seeing a nutritionist.  If you are interested, please call Dr Sykes at [phone] to schedule an appointment.   

## 2021-04-15 NOTE — Progress Notes (Signed)
Subjective:  Patient ID: Kevin Brady, male    DOB: [birth year], 65 y.o.   MRN: [id number]  Patient Care Team: Baruch Gouty, FNP as PCP - General (Family Medicine)   Chief Complaint:  Hypertension (Follow up/)   HPI: Kevin Brady is a 65 y.o. male presenting on 04/15/2021 for Hypertension (Follow up/)   Pt presents today for hypertension and GAD follow up. Has been taking medications as prescribed without associated side effects. States his blood pressure has been much better, 130-150-80 range at home. States he is still anxious and is having trouble sleeping.   Hypertension This is a recurrent problem. The current episode started more than 1 month ago. The problem has been gradually improving since onset. The problem is controlled. Associated symptoms include anxiety. Pertinent negatives include no blurred vision, chest pain, headaches, malaise/fatigue, neck pain, orthopnea, palpitations, peripheral edema, PND, shortness of breath or sweats. There are no associated agents to hypertension. Risk factors for coronary artery disease include dyslipidemia, male gender, family history, smoking/tobacco exposure and stress. Past treatments include ACE inhibitors. The current treatment provides significant improvement. Compliance problems include exercise and diet.  There is no history of angina, kidney disease, CAD/MI, CVA, heart failure, left ventricular hypertrophy, PVD or retinopathy.  Anxiety Presents for follow-up visit. Symptoms include decreased concentration, excessive worry, insomnia, malaise, nervous/anxious behavior and restlessness. Patient reports no chest pain, compulsions, confusion, depressed mood, dizziness, dry mouth, feeling of choking, hyperventilation, impotence, irritability, muscle tension, nausea, obsessions, palpitations, panic, shortness of breath or suicidal ideas. Symptoms occur most days. The severity of symptoms is moderate. The quality of sleep is fair. Nighttime  awakenings: occasional.   Compliance with medications is 76-100%.  Depression screen [facility] 2/9 04/15/2021 03/04/2021 02/23/2021 11/22/2017 11/21/2016  Decreased Interest 1 1 0 0 0  Down, Depressed, Hopeless 1 1 0 1 0  PHQ - 2 Score 2 2 0 1 0  Altered sleeping '2 1 2 ' - -  Tired, decreased energy '1 1 1 ' - -  Change in appetite 2 0 0 - -  Feeling bad or failure about yourself  1 1 0 - -  Trouble concentrating 1 0 0 - -  Moving slowly or fidgety/restless 0 0 0 - -  Suicidal thoughts 0 0 0 - -  PHQ-9 Score '9 5 3 ' - -  Difficult doing work/chores Somewhat difficult Somewhat difficult Somewhat difficult - -   GAD 7 : Generalized Anxiety Score 04/15/2021 03/04/2021 02/23/2021  Nervous, Anxious, on Edge '1 1 2  ' Control/stop worrying '2 1 2  ' Worry too much - different things '2 1 2  ' Trouble relaxing '2 2 2  ' Restless 1 1 0  Easily annoyed or irritable 0 1 1  Afraid - awful might happen '1 1 1  ' Total GAD 7 Score '9 8 10  ' Anxiety Difficulty Somewhat difficult Somewhat difficult Somewhat difficult       Relevant past medical, surgical, family, and social history reviewed and updated as indicated.  Allergies and medications reviewed and updated. Data reviewed: Chart in Epic.   Past Medical History:  Diagnosis Date   Arthritis     History reviewed. No pertinent surgical history.  Social History   Socioeconomic History   Marital status: Married    Spouse name: Not on file   Number of children: Not on file   Years of education: Not on file   Highest education level: Not on file  Occupational History   Not on file  Tobacco Use   Smoking status: Former    Packs/day: 0.50    Types: Cigarettes    Start date: 03/05/1975    Quit date: 03/04/1977    Years since quitting: 44.1   Smokeless tobacco: Never  Vaping Use   Vaping Use: Never used  Substance and Sexual Activity   Alcohol use: Yes    Comment: rare   Drug use: No   Sexual activity: Not on file  Other Topics Concern   Not on file  Social  History Narrative   Not on file   Social Determinants of Health   Financial Resource Strain: Not on file  Food Insecurity: Not on file  Transportation Needs: Not on file  Physical Activity: Not on file  Stress: Not on file  Social Connections: Not on file  Intimate Partner Violence: Not on file    Outpatient Encounter Medications as of 04/15/2021  Medication Sig   cetirizine (ZYRTEC) 10 MG tablet Take 1 tablet (10 mg total) by mouth daily.   FLUoxetine (PROZAC) 40 MG capsule Take 1 capsule (40 mg total) by mouth daily.   fluticasone (FLONASE) 50 MCG/ACT nasal spray Place 2 sprays into both nostrils daily.   lisinopril (ZESTRIL) 20 MG tablet Take 1 tablet (20 mg total) by mouth daily.   [DISCONTINUED] FLUoxetine (PROZAC) 20 MG tablet Take 1 tablet (20 mg total) by mouth at bedtime.   [DISCONTINUED] lisinopril (ZESTRIL) 10 MG tablet Take 1 tablet (10 mg total) by mouth daily.   No facility-administered encounter medications on file as of 04/15/2021.    Allergies  Allergen Reactions   Penicillins Rash   Xyzal [Levocetirizine Dihydrochloride]     Fatigue    Nexium [Esomeprazole Magnesium] Other (See Comments)    constipation   Paxil [Paroxetine Hcl] Other (See Comments)    fatigue    Review of Systems  Constitutional:  Positive for activity change, appetite change and fatigue. Negative for chills, diaphoresis, fever, irritability, malaise/fatigue and unexpected weight change.  HENT: Negative.    Eyes: Negative.  Negative for blurred vision.  Respiratory:  Negative for cough, chest tightness and shortness of breath.   Cardiovascular:  Negative for chest pain, palpitations, orthopnea, leg swelling and PND.  Gastrointestinal:  Negative for abdominal pain, blood in stool, constipation, diarrhea, nausea and vomiting.  Endocrine: Negative.   Genitourinary:  Negative for decreased urine volume, difficulty urinating, dysuria, frequency, impotence and urgency.  Musculoskeletal:   Negative for arthralgias, myalgias and neck pain.  Skin: Negative.   Allergic/Immunologic: Negative.   Neurological:  Negative for dizziness, tremors, seizures, syncope, facial asymmetry, speech difficulty, weakness, light-headedness, numbness and headaches.  Hematological: Negative.   Psychiatric/Behavioral:  Positive for decreased concentration and dysphoric mood. Negative for agitation, behavioral problems, confusion, hallucinations, self-injury, sleep disturbance and suicidal ideas. The patient is nervous/anxious and has insomnia. The patient is not hyperactive.   All other systems reviewed and are negative.      Objective:  BP (!) 143/81   Pulse 84   Ht '5\' 11"'  (1.803 m)   Wt 180 lb 12.8 oz (82 kg)   SpO2 97%   BMI 25.22 kg/m    Wt Readings from Last 3 Encounters:  04/15/21 180 lb 12.8 oz (82 kg)  03/04/21 175 lb (79.4 kg)  02/23/21 176 lb (79.8 kg)    Physical Exam Vitals and nursing note reviewed.  Constitutional:      General: He is not in acute distress.    Appearance: Normal appearance. He is well-developed,  well-groomed and normal weight. He is not ill-appearing, toxic-appearing or diaphoretic.  HENT:     Head: Normocephalic and atraumatic.     Jaw: There is normal jaw occlusion.     Right Ear: Hearing normal.     Left Ear: Hearing normal.     Nose: Nose normal.     Mouth/Throat:     Lips: Pink.     Mouth: Mucous membranes are moist.     Pharynx: Oropharynx is clear. Uvula midline.  Eyes:     General: Lids are normal.     Extraocular Movements: Extraocular movements intact.     Conjunctiva/sclera: Conjunctivae normal.     Pupils: Pupils are equal, round, and reactive to light.  Neck:     Thyroid: No thyroid mass, thyromegaly or thyroid tenderness.     Vascular: No carotid bruit or JVD.     Trachea: Trachea and phonation normal.  Cardiovascular:     Rate and Rhythm: Normal rate and regular rhythm.     Chest Wall: PMI is not displaced.     Pulses: Normal  pulses.     Heart sounds: Normal heart sounds. No murmur heard.   No friction rub. No gallop.  Pulmonary:     Effort: Pulmonary effort is normal. No respiratory distress.     Breath sounds: Normal breath sounds. No wheezing.  Abdominal:     General: Bowel sounds are normal. There is no distension or abdominal bruit.     Palpations: Abdomen is soft. There is no hepatomegaly or splenomegaly.     Tenderness: There is no abdominal tenderness. There is no right CVA tenderness or left CVA tenderness.     Hernia: No hernia is present.  Musculoskeletal:        General: Normal range of motion.     Cervical back: Normal range of motion and neck supple.     Right lower leg: No edema.     Left lower leg: No edema.  Lymphadenopathy:     Cervical: No cervical adenopathy.  Skin:    General: Skin is warm and dry.     Capillary Refill: Capillary refill takes less than 2 seconds.     Coloration: Skin is not cyanotic, jaundiced or pale.     Findings: No rash.  Neurological:     General: No focal deficit present.     Mental Status: He is alert and oriented to person, place, and time.     Cranial Nerves: Cranial nerves are intact.     Sensory: Sensation is intact.     Motor: Motor function is intact.     Coordination: Coordination is intact.     Gait: Gait is intact.     Deep Tendon Reflexes: Reflexes are normal and symmetric.  Psychiatric:        Attention and Perception: Attention and perception normal.        Mood and Affect: Affect normal. Mood is anxious.        Speech: Speech normal.        Behavior: Behavior normal. Behavior is cooperative.        Thought Content: Thought content normal.        Cognition and Memory: Cognition and memory normal.        Judgment: Judgment normal.    Results for orders placed or performed in visit on 03/04/21  BMP8+EGFR  Result Value Ref Range   Glucose 128 (H) 65 - 99 mg/dL   BUN 11 8 - 27 mg/dL  Creatinine, Ser 0.92 0.76 - 1.27 mg/dL   eGFR 93 >59  mL/min/1.73   BUN/Creatinine Ratio 12 10 - 24   Sodium 137 134 - 144 mmol/L   Potassium 4.1 3.5 - 5.2 mmol/L   Chloride 100 96 - 106 mmol/L   CO2 22 20 - 29 mmol/L   Calcium 9.1 8.6 - 10.2 mg/dL       Pertinent labs & imaging results that were available during my care of the patient were reviewed by me and considered in my medical decision making.  Assessment & Plan:  Kapena was seen today for hypertension.  Diagnoses and all orders for this visit:  Primary hypertension Almost at goal. Will increase lisinopril to 20 mg daily. DASH diet and exercise discussed in detail. Follow up in 3 months.  -     lisinopril (ZESTRIL) 20 MG tablet; Take 1 tablet (20 mg total) by mouth daily.  GAD (generalized anxiety disorder) Ongoing symptoms. Will increase fluoxetine to 40 mg daily. Pt aware to report any new, worsening, or persistent symptoms. Aware to try melatonin at night to help with sleep. Follow up in 3 months or sooner if needed.  -     FLUoxetine (PROZAC) 40 MG capsule; Take 1 capsule (40 mg total) by mouth daily.     Continue all other maintenance medications.  Follow up plan: Return in about 3 months (around 07/16/2021), or if symptoms worsen or fail to improve, for HTN, GAD.   Continue healthy lifestyle choices, including diet (rich in fruits, vegetables, and lean proteins, and low in salt and simple carbohydrates) and exercise (at least 30 minutes of moderate physical activity daily).  Educational handout given for DASH diet  The above assessment and management plan was discussed with the patient. The patient verbalized understanding of and has agreed to the management plan. Patient is aware to call the clinic if they develop any new symptoms or if symptoms persist or worsen. Patient is aware when to return to the clinic for a follow-up visit. Patient educated on when it is appropriate to go to the emergency department.   Monia Pouch, FNP-C [facility] [phone]

## 2021-06-03 ENCOUNTER — Encounter: Payer: Self-pay | Admitting: Radiology

## 2021-07-16 ENCOUNTER — Ambulatory Visit: Payer: 59 | Admitting: Family Medicine

## 2021-07-16 ENCOUNTER — Encounter: Payer: Self-pay | Admitting: Family Medicine

## 2021-07-16 VITALS — BP 196/93 | HR 59 | Ht 71.0 in | Wt 182.0 lb

## 2021-07-16 DIAGNOSIS — F411 Generalized anxiety disorder: Secondary | ICD-10-CM | POA: Diagnosis not present

## 2021-07-16 DIAGNOSIS — E785 Hyperlipidemia, unspecified: Secondary | ICD-10-CM | POA: Diagnosis not present

## 2021-07-16 DIAGNOSIS — J301 Allergic rhinitis due to pollen: Secondary | ICD-10-CM | POA: Insufficient documentation

## 2021-07-16 DIAGNOSIS — I10 Essential (primary) hypertension: Secondary | ICD-10-CM

## 2021-07-16 DIAGNOSIS — R7309 Other abnormal glucose: Secondary | ICD-10-CM

## 2021-07-16 LAB — BAYER DCA HB A1C WAIVED: HB A1C (BAYER DCA - WAIVED): 4.9 % (ref 4.8–5.6)

## 2021-07-16 MED ORDER — LOSARTAN POTASSIUM 25 MG PO TABS
25.0000 mg | ORAL_TABLET | Freq: Every day | ORAL | 1 refills | Status: DC
Start: 2021-07-16 — End: 2021-07-16

## 2021-07-16 MED ORDER — LOSARTAN POTASSIUM 25 MG PO TABS: 25.0000 mg | ORAL_TABLET | Freq: Every day | ORAL | 1 refills | Status: DC

## 2021-07-16 NOTE — Progress Notes (Signed)
Refill failed. resent °

## 2021-07-16 NOTE — Patient Instructions (Signed)

## 2021-07-16 NOTE — Addendum Note (Signed)
Addended by: Antonietta Barcelona D on: 07/16/2021 04:10 PM   Modules accepted: Orders

## 2021-07-16 NOTE — Progress Notes (Signed)
Subjective:  Patient ID: Kevin Brady, male    DOB: [birth year], 66 y.o.   MRN: [id number]  Patient Care Team: Baruch Gouty, FNP as PCP - General (Family Medicine)   Chief Complaint:  Medical Management of Chronic Issues   HPI: Kevin Brady is a 66 y.o. male presenting on 07/16/2021 for Medical Management of Chronic Issues   1. Primary hypertension Pt was started on lisinopril for his hypertension. States he felt the medication caused a significant headache so he just stopped taking it and started taking his wife's metoprolol. He states he only took her medications for a few days. Has not had any BP medications since Thanksgiving. He denies continued headaches. No chest pain, palpitations, visual changes, leg swelling, weakness, confusion, or syncope.   2. GAD (generalized anxiety disorder) Stopped prescribed fluoxetine as he felt this was causing upset stomach. Has not been on anything for anxiety in several weeks. He does have continued symptoms. GAD and PHQ below.   3.Hyperlipidemia LDL goal <100 Not on statin therapy and does not follow a diet or exercise plan.   Last glucose was noted to be elevated, no prior history of diabetes. Will check A1C.   GAD 7 : Generalized Anxiety Score 07/16/2021 04/15/2021 03/04/2021 02/23/2021  Nervous, Anxious, on Edge '1 1 1 2  ' Control/stop worrying '1 2 1 2  ' Worry too much - different things '1 2 1 2  ' Trouble relaxing '2 2 2 2  ' Restless 0 1 1 0  Easily annoyed or irritable 1 0 1 1  Afraid - awful might happen '1 1 1 1  ' Total GAD 7 Score '7 9 8 10  ' Anxiety Difficulty Somewhat difficult Somewhat difficult Somewhat difficult Somewhat difficult    Depression screen [facility] 2/9 07/16/2021 04/15/2021 03/04/2021 02/23/2021 11/22/2017  Decreased Interest '1 1 1 ' 0 0  Down, Depressed, Hopeless '1 1 1 ' 0 1  PHQ - 2 Score '2 2 2 ' 0 1  Altered sleeping '2 2 1 2 ' -  Tired, decreased energy '1 1 1 1 ' -  Change in appetite 1 2 0 0 -  Feeling bad or failure about yourself   '1 1 1 ' 0 -  Trouble concentrating 1 1 0 0 -  Moving slowly or fidgety/restless 0 0 0 0 -  Suicidal thoughts 0 0 0 0 -  PHQ-9 Score '8 9 5 3 ' -  Difficult doing work/chores Somewhat difficult Somewhat difficult Somewhat difficult Somewhat difficult -     Relevant past medical, surgical, family, and social history reviewed and updated as indicated.  Allergies and medications reviewed and updated. Data reviewed: Chart in Epic.   Past Medical History:  Diagnosis Date   Arthritis     History reviewed. No pertinent surgical history.  Social History   Socioeconomic History   Marital status: Married    Spouse name: Not on file   Number of children: Not on file   Years of education: Not on file   Highest education level: Not on file  Occupational History   Not on file  Tobacco Use   Smoking status: Former    Packs/day: 0.50    Types: Cigarettes    Start date: 03/05/1975    Quit date: 03/04/1977    Years since quitting: 44.3   Smokeless tobacco: Never  Vaping Use   Vaping Use: Never used  Substance and Sexual Activity   Alcohol use: Yes    Comment: rare   Drug use: No  Sexual activity: Not on file  Other Topics Concern   Not on file  Social History Narrative   Not on file   Social Determinants of Health   Financial Resource Strain: Not on file  Food Insecurity: Not on file  Transportation Needs: Not on file  Physical Activity: Not on file  Stress: Not on file  Social Connections: Not on file  Intimate Partner Violence: Not on file    Outpatient Encounter Medications as of 07/16/2021  Medication Sig   cetirizine (ZYRTEC) 10 MG tablet Take 1 tablet (10 mg total) by mouth daily.   fluticasone (FLONASE) 50 MCG/ACT nasal spray Place 2 sprays into both nostrils daily.   losartan (COZAAR) 25 MG tablet Take 1 tablet (25 mg total) by mouth daily.   [DISCONTINUED] FLUoxetine (PROZAC) 40 MG capsule Take 1 capsule (40 mg total) by mouth daily.   [DISCONTINUED] lisinopril  (ZESTRIL) 20 MG tablet Take 1 tablet (20 mg total) by mouth daily.   No facility-administered encounter medications on file as of 07/16/2021.    Allergies  Allergen Reactions   Penicillins Rash   Xyzal [Levocetirizine Dihydrochloride]     Fatigue    Nexium [Esomeprazole Magnesium] Other (See Comments)    constipation   Paxil [Paroxetine Hcl] Other (See Comments)    fatigue    Review of Systems  Constitutional:  Positive for activity change, appetite change and fatigue. Negative for chills, diaphoresis, fever and unexpected weight change.  HENT: Negative.    Eyes: Negative.   Respiratory:  Negative for cough, chest tightness and shortness of breath.   Cardiovascular:  Negative for chest pain, palpitations and leg swelling.  Gastrointestinal:  Negative for abdominal pain, blood in stool, constipation, diarrhea, nausea and vomiting.  Endocrine: Negative.  Negative for polydipsia, polyphagia and polyuria.  Genitourinary:  Negative for decreased urine volume, difficulty urinating, dysuria, frequency and urgency.  Musculoskeletal:  Negative for arthralgias and myalgias.  Skin: Negative.   Allergic/Immunologic: Negative.   Neurological:  Negative for dizziness, tremors, seizures, syncope, facial asymmetry, speech difficulty, weakness, light-headedness, numbness and headaches.  Hematological: Negative.   Psychiatric/Behavioral:  Positive for agitation, decreased concentration, dysphoric mood and sleep disturbance. Negative for behavioral problems, confusion, hallucinations, self-injury and suicidal ideas. The patient is nervous/anxious. The patient is not hyperactive.   All other systems reviewed and are negative.      Objective:  BP (!) 196/93    Pulse (!) 59    Ht '5\' 11"'  (1.803 m)    Wt 182 lb (82.6 kg)    SpO2 96%    BMI 25.38 kg/m    Wt Readings from Last 3 Encounters:  07/16/21 182 lb (82.6 kg)  04/15/21 180 lb 12.8 oz (82 kg)  03/04/21 175 lb (79.4 kg)    Physical  Exam Vitals and nursing note reviewed.  Constitutional:      General: He is not in acute distress.    Appearance: Normal appearance. He is normal weight. He is not ill-appearing, toxic-appearing or diaphoretic.  HENT:     Head: Normocephalic and atraumatic.     Mouth/Throat:     Mouth: Mucous membranes are moist.  Eyes:     Conjunctiva/sclera: Conjunctivae normal.     Pupils: Pupils are equal, round, and reactive to light.  Cardiovascular:     Rate and Rhythm: Regular rhythm. Bradycardia present.     Heart sounds: Normal heart sounds. No murmur heard.   No friction rub. No gallop.  Pulmonary:     Effort:  Pulmonary effort is normal.     Breath sounds: Normal breath sounds.  Musculoskeletal:     Right lower leg: No edema.     Left lower leg: No edema.  Skin:    General: Skin is warm and dry.     Capillary Refill: Capillary refill takes less than 2 seconds.  Neurological:     General: No focal deficit present.     Mental Status: He is alert and oriented to person, place, and time.  Psychiatric:        Attention and Perception: Attention and perception normal.        Mood and Affect: Mood is anxious.        Speech: Speech is rapid and pressured.        Behavior: Behavior normal.        Thought Content: Thought content normal.        Cognition and Memory: Cognition and memory normal.        Judgment: Judgment normal.    Results for orders placed or performed in visit on 03/04/21  BMP8+EGFR  Result Value Ref Range   Glucose 128 (H) 65 - 99 mg/dL   BUN 11 8 - 27 mg/dL   Creatinine, Ser 0.92 0.76 - 1.27 mg/dL   eGFR 93 >59 mL/min/1.73   BUN/Creatinine Ratio 12 10 - 24   Sodium 137 134 - 144 mmol/L   Potassium 4.1 3.5 - 5.2 mmol/L   Chloride 100 96 - 106 mmol/L   CO2 22 20 - 29 mmol/L   Calcium 9.1 8.6 - 10.2 mg/dL       Pertinent labs & imaging results that were available during my care of the patient were reviewed by me and considered in my medical decision  making.  Assessment & Plan:  Noemi was seen today for medical management of chronic issues.  Diagnoses and all orders for this visit:  Primary hypertension Long discussion about medication compliance importance. Pt aware not to take medications prescribed to other people. Diet and exercise encouraged. Will trial losartan. Pt aware to take as prescribed and to call office if he has any adverse side effects.  -     BMP8+EGFR -     CBC with Differential/Platelet -     Lipid panel -     Thyroid Panel With TSH -     losartan (COZAAR) 25 MG tablet; Take 1 tablet (25 mg total) by mouth daily.  Hyperlipidemia LDL goal <100 Not on statin therapy. Will check labs today and start therapy warranted. Diet and exercise encouraged.  -     BMP8+EGFR -     Lipid panel -     Thyroid Panel With TSH  GAD (generalized anxiety disorder) Stopped fluoxetine as he felt it was upsetting his stomach. Due to reported side effects to medications, will wait until next visit to trial something else for anxiety as new BP medication was started today.  -     Thyroid Panel With TSH  Elevated glucose Last 2 readings elevated. Will check A1C today. Diet and exercise discussed in detail.  -     BMP8+EGFR -     Bayer DCA Hb A1c Waived     Continue all other maintenance medications.  Follow up plan: Return in about 2 weeks (around 07/30/2021), or if symptoms worsen or fail to improve, for HTN.   Continue healthy lifestyle choices, including diet (rich in fruits, vegetables, and lean proteins, and low in salt and simple carbohydrates)  and exercise (at least 30 minutes of moderate physical activity daily).  Educational handout given for DASH diet  The above assessment and management plan was discussed with the patient. The patient verbalized understanding of and has agreed to the management plan. Patient is aware to call the clinic if they develop any new symptoms or if symptoms persist or worsen. Patient is aware  when to return to the clinic for a follow-up visit. Patient educated on when it is appropriate to go to the emergency department.   Monia Pouch, FNP-C [facility] [phone]

## 2021-07-17 LAB — LIPID PANEL
Chol/HDL Ratio: 4.6 ratio (ref 0.0–5.0)
Cholesterol, Total: 209 mg/dL — ABNORMAL HIGH (ref 100–199)
HDL: 45 mg/dL (ref >39)
LDL Chol Calc (NIH): 143 mg/dL — ABNORMAL HIGH (ref 0–99)
Triglycerides: 118 mg/dL (ref 0–149)
VLDL Cholesterol Cal: 21 mg/dL (ref 5–40)

## 2021-07-17 LAB — CBC WITH DIFFERENTIAL/PLATELET
Basophils Absolute: 0.1 10*3/uL (ref 0.0–0.2)
Basos: 1 %
EOS (ABSOLUTE): 0.1 10*3/uL (ref 0.0–0.4)
Eos: 2 %
Hematocrit: 46.2 % (ref 37.5–51.0)
Hemoglobin: 15.7 g/dL (ref 13.0–17.7)
Immature Grans (Abs): 0 10*3/uL (ref 0.0–0.1)
Immature Granulocytes: 0 %
Lymphocytes Absolute: 1.5 10*3/uL (ref 0.7–3.1)
Lymphs: 27 %
MCH: 31.3 pg (ref 26.6–33.0)
MCHC: 34 g/dL (ref 31.5–35.7)
MCV: 92 fL (ref 79–97)
Monocytes Absolute: 0.4 10*3/uL (ref 0.1–0.9)
Monocytes: 8 %
Neutrophils Absolute: 3.4 10*3/uL (ref 1.4–7.0)
Neutrophils: 62 %
Platelets: 294 10*3/uL (ref 150–450)
RBC: 5.02 x10E6/uL (ref 4.14–5.80)
RDW: 12.5 % (ref 11.6–15.4)
WBC: 5.4 10*3/uL (ref 3.4–10.8)

## 2021-07-17 LAB — BMP8+EGFR
BUN/Creatinine Ratio: 15 (ref 10–24)
BUN: 13 mg/dL (ref 8–27)
CO2: 25 mmol/L (ref 20–29)
Calcium: 9.2 mg/dL (ref 8.6–10.2)
Chloride: 103 mmol/L (ref 96–106)
Creatinine, Ser: 0.86 mg/dL (ref 0.76–1.27)
Glucose: 92 mg/dL (ref 70–99)
Potassium: 4.5 mmol/L (ref 3.5–5.2)
Sodium: 141 mmol/L (ref 134–144)
eGFR: 97 mL/min/{1.73_m2} (ref >59)

## 2021-07-17 LAB — THYROID PANEL WITH TSH
Free Thyroxine Index: 1.9 (ref 1.2–4.9)
T3 Uptake Ratio: 26 % (ref 24–39)
T4, Total: 7.4 ug/dL (ref 4.5–12.0)
TSH: 3.5 u[IU]/mL (ref 0.450–4.500)

## 2021-07-30 ENCOUNTER — Ambulatory Visit: Payer: 59 | Admitting: Family Medicine

## 2021-07-30 ENCOUNTER — Encounter: Payer: Self-pay | Admitting: Family Medicine

## 2021-07-30 VITALS — BP 157/88 | HR 82 | Temp 98.4°F | Ht 71.0 in | Wt 179.0 lb

## 2021-07-30 DIAGNOSIS — I10 Essential (primary) hypertension: Secondary | ICD-10-CM

## 2021-07-30 DIAGNOSIS — F411 Generalized anxiety disorder: Secondary | ICD-10-CM

## 2021-07-30 MED ORDER — HYDROXYZINE PAMOATE 25 MG PO CAPS: 25.0000 mg | ORAL_CAPSULE | Freq: Three times a day (TID) | ORAL | 6 refills | Status: DC | PRN

## 2021-07-30 MED ORDER — SERTRALINE HCL 25 MG PO TABS: 25.0000 mg | ORAL_TABLET | Freq: Every day | ORAL | 3 refills | Status: DC

## 2021-07-30 MED ORDER — LOSARTAN POTASSIUM-HCTZ 50-12.5 MG PO TABS
1.0000 | ORAL_TABLET | Freq: Every day | ORAL | 3 refills | Status: DC
Start: 2021-07-30 — End: 2022-02-25

## 2021-07-30 NOTE — Progress Notes (Signed)
Subjective:  Patient ID: Kevin Brady, male    DOB: 02-09-1956, 66 y.o.   MRN: [id number]  Patient Care Team: Baruch Gouty, FNP as PCP - General (Family Medicine)   Chief Complaint:  Hypertension and Anxiety   HPI: Kevin Brady is a 66 y.o. male presenting on 07/30/2021 for Hypertension and Anxiety   Hypertension This is a recurrent problem. The current episode started more than 1 month ago. The problem has been waxing and waning since onset. The problem is uncontrolled. Associated symptoms include anxiety and malaise/fatigue. Pertinent negatives include no blurred vision, chest pain, headaches, neck pain, orthopnea, palpitations, peripheral edema, PND, shortness of breath or sweats. Risk factors for coronary artery disease include male gender and sedentary lifestyle. Past treatments include ACE inhibitors, angiotensin blockers and lifestyle changes (has increased dose of losartan on own at home, currnetly taking 50 mg daily). The current treatment provides moderate improvement. Compliance problems include exercise, diet and medication side effects.   Anxiety Presents for follow-up visit. Symptoms include decreased concentration, depressed mood, excessive worry, irritability, malaise, nervous/anxious behavior, obsessions and restlessness. Patient reports no chest pain, compulsions, confusion, dizziness, dry mouth, feeling of choking, hyperventilation, impotence, insomnia, muscle tension, nausea, palpitations, panic, shortness of breath or suicidal ideas. Symptoms occur constantly. The severity of symptoms is causing significant distress and moderate. The quality of sleep is fair. Nighttime awakenings: one to two.   Compliance with medications is 0-25%. Side effects of treatment include headaches and GI discomfort.   GAD 7 : Generalized Anxiety Score 07/30/2021 07/16/2021 04/15/2021 03/04/2021  Nervous, Anxious, on Edge '1 1 1 1  ' Control/stop worrying '1 1 2 1  ' Worry too much - different  things '1 1 2 1  ' Trouble relaxing '2 2 2 2  ' Restless 0 0 1 1  Easily annoyed or irritable 1 1 0 1  Afraid - awful might happen '1 1 1 1  ' Total GAD 7 Score '7 7 9 8  ' Anxiety Difficulty Somewhat difficult Somewhat difficult Somewhat difficult Somewhat difficult    Depression screen [facility] 2/9 07/30/2021 07/16/2021 04/15/2021 03/04/2021 02/23/2021  Decreased Interest '1 1 1 1 ' 0  Down, Depressed, Hopeless '1 1 1 1 ' 0  PHQ - 2 Score '2 2 2 2 ' 0  Altered sleeping '2 2 2 1 2  ' Tired, decreased energy '1 1 1 1 1  ' Change in appetite '1 1 2 ' 0 0  Feeling bad or failure about yourself  '1 1 1 1 ' 0  Trouble concentrating '1 1 1 ' 0 0  Moving slowly or fidgety/restless 0 0 0 0 0  Suicidal thoughts 0 0 0 0 0  PHQ-9 Score '8 8 9 5 3  ' Difficult doing work/chores Somewhat difficult Somewhat difficult Somewhat difficult Somewhat difficult Somewhat difficult     Relevant past medical, surgical, family, and social history reviewed and updated as indicated.  Allergies and medications reviewed and updated. Data reviewed: Chart in Epic.   Past Medical History:  Diagnosis Date   Arthritis     History reviewed. No pertinent surgical history.  Social History   Socioeconomic History   Marital status: Married    Spouse name: Not on file   Number of children: Not on file   Years of education: Not on file   Highest education level: Not on file  Occupational History   Not on file  Tobacco Use   Smoking status: Former    Packs/day: 0.50    Types: Cigarettes  Start date: 03/05/1975    Quit date: 03/04/1977    Years since quitting: 44.4   Smokeless tobacco: Never  Vaping Use   Vaping Use: Never used  Substance and Sexual Activity   Alcohol use: Yes    Comment: rare   Drug use: No   Sexual activity: Not on file  Other Topics Concern   Not on file  Social History Narrative   Not on file   Social Determinants of Health   Financial Resource Strain: Not on file  Food Insecurity: Not on file  Transportation Needs:  Not on file  Physical Activity: Not on file  Stress: Not on file  Social Connections: Not on file  Intimate Partner Violence: Not on file    Outpatient Encounter Medications as of 07/30/2021  Medication Sig   cetirizine (ZYRTEC) 10 MG tablet Take 1 tablet (10 mg total) by mouth daily.   fluticasone (FLONASE) 50 MCG/ACT nasal spray Place 2 sprays into both nostrils daily.   hydrOXYzine (VISTARIL) 25 MG capsule Take 1 capsule (25 mg total) by mouth 3 (three) times daily as needed for anxiety.   losartan-hydrochlorothiazide (HYZAAR) 50-12.5 MG tablet Take 1 tablet by mouth daily.   sertraline (ZOLOFT) 25 MG tablet Take 1 tablet (25 mg total) by mouth at bedtime.   [DISCONTINUED] losartan (COZAAR) 25 MG tablet Take 1 tablet (25 mg total) by mouth daily.   No facility-administered encounter medications on file as of 07/30/2021.    Allergies  Allergen Reactions   Penicillins Rash   Xyzal [Levocetirizine Dihydrochloride]     Fatigue    Nexium [Esomeprazole Magnesium] Other (See Comments)    constipation   Paxil [Paroxetine Hcl] Other (See Comments)    fatigue    Review of Systems  Constitutional:  Positive for activity change, appetite change, fatigue, irritability and malaise/fatigue. Negative for chills, diaphoresis, fever and unexpected weight change.  Eyes:  Negative for blurred vision, photophobia and visual disturbance.  Respiratory:  Negative for cough and shortness of breath.   Cardiovascular:  Negative for chest pain, palpitations, orthopnea, leg swelling and PND.  Gastrointestinal:  Negative for abdominal distention, abdominal pain and nausea.  Endocrine: Negative for cold intolerance, heat intolerance, polydipsia, polyphagia and polyuria.  Genitourinary:  Negative for decreased urine volume, difficulty urinating and impotence.  Musculoskeletal:  Negative for neck pain.  Neurological:  Negative for dizziness, tremors, seizures, syncope, facial asymmetry, speech difficulty,  weakness, light-headedness, numbness and headaches.  Psychiatric/Behavioral:  Positive for agitation, decreased concentration, dysphoric mood and sleep disturbance. Negative for behavioral problems, confusion, hallucinations, self-injury and suicidal ideas. The patient is nervous/anxious. The patient does not have insomnia and is not hyperactive.   All other systems reviewed and are negative.      Objective:  BP (!) 157/88    Pulse 82    Temp 98.4 F (36.9 C)    Ht '5\' 11"'  (1.803 m)    Wt 179 lb (81.2 kg)    SpO2 97%    BMI 24.97 kg/m    Wt Readings from Last 3 Encounters:  07/30/21 179 lb (81.2 kg)  07/16/21 182 lb (82.6 kg)  04/15/21 180 lb 12.8 oz (82 kg)    Physical Exam Vitals and nursing note reviewed.  Constitutional:      General: He is not in acute distress.    Appearance: Normal appearance. He is well-developed, well-groomed and normal weight. He is not ill-appearing, toxic-appearing or diaphoretic.  HENT:     Head: Normocephalic and atraumatic.  Jaw: There is normal jaw occlusion.     Right Ear: Hearing normal.     Left Ear: Hearing normal.     Nose: Nose normal.     Mouth/Throat:     Lips: Pink.     Mouth: Mucous membranes are moist.     Pharynx: Oropharynx is clear. Uvula midline.  Eyes:     General: Lids are normal.     Extraocular Movements: Extraocular movements intact.     Conjunctiva/sclera: Conjunctivae normal.     Pupils: Pupils are equal, round, and reactive to light.  Neck:     Thyroid: No thyroid mass, thyromegaly or thyroid tenderness.     Vascular: No carotid bruit or JVD.     Trachea: Trachea and phonation normal.  Cardiovascular:     Rate and Rhythm: Normal rate and regular rhythm.     Chest Wall: PMI is not displaced.     Pulses: Normal pulses.     Heart sounds: Normal heart sounds. No murmur heard.   No friction rub. No gallop.  Pulmonary:     Effort: Pulmonary effort is normal. No respiratory distress.     Breath sounds: Normal breath  sounds. No wheezing.  Abdominal:     General: Bowel sounds are normal. There is no distension or abdominal bruit.     Palpations: Abdomen is soft. There is no hepatomegaly or splenomegaly.     Tenderness: There is no abdominal tenderness. There is no right CVA tenderness or left CVA tenderness.     Hernia: No hernia is present.  Musculoskeletal:        General: Normal range of motion.     Cervical back: Normal range of motion and neck supple.     Right lower leg: No edema.     Left lower leg: No edema.  Lymphadenopathy:     Cervical: No cervical adenopathy.  Skin:    General: Skin is warm and dry.     Capillary Refill: Capillary refill takes less than 2 seconds.     Coloration: Skin is not cyanotic, jaundiced or pale.     Findings: No rash.  Neurological:     General: No focal deficit present.     Mental Status: He is alert and oriented to person, place, and time.     Sensory: Sensation is intact.     Motor: Motor function is intact.     Coordination: Coordination is intact.     Gait: Gait is intact.     Deep Tendon Reflexes: Reflexes are normal and symmetric.  Psychiatric:        Attention and Perception: Attention and perception normal.        Mood and Affect: Affect normal. Mood is anxious.        Speech: Speech is rapid and pressured.        Behavior: Behavior normal. Behavior is cooperative.        Thought Content: Thought content normal.        Cognition and Memory: Cognition and memory normal.        Judgment: Judgment normal.    Results for orders placed or performed in visit on 07/16/21  [facility]  Result Value Ref Range   Glucose 92 70 - 99 mg/dL   BUN 13 8 - 27 mg/dL   Creatinine, Ser 0.86 0.76 - 1.27 mg/dL   eGFR 97 >59 mL/min/1.73   BUN/Creatinine Ratio 15 10 - 24   Sodium 141 134 - 144 mmol/L  Potassium 4.5 3.5 - 5.2 mmol/L   Chloride 103 96 - 106 mmol/L   CO2 25 20 - 29 mmol/L   Calcium 9.2 8.6 - 10.2 mg/dL  CBC with Differential/Platelet  Result  Value Ref Range   WBC 5.4 3.4 - 10.8 x10E3/uL   RBC 5.02 4.14 - 5.80 x10E6/uL   Hemoglobin 15.7 13.0 - 17.7 g/dL   Hematocrit 46.2 37.5 - 51.0 %   MCV 92 79 - 97 fL   MCH 31.3 26.6 - 33.0 pg   MCHC 34.0 31.5 - 35.7 g/dL   RDW 12.5 11.6 - 15.4 %   Platelets 294 150 - 450 x10E3/uL   Neutrophils 62 Not Estab. %   Lymphs 27 Not Estab. %   Monocytes 8 Not Estab. %   Eos 2 Not Estab. %   Basos 1 Not Estab. %   Neutrophils Absolute 3.4 1.4 - 7.0 x10E3/uL   Lymphocytes Absolute 1.5 0.7 - 3.1 x10E3/uL   Monocytes Absolute 0.4 0.1 - 0.9 x10E3/uL   EOS (ABSOLUTE) 0.1 0.0 - 0.4 x10E3/uL   Basophils Absolute 0.1 0.0 - 0.2 x10E3/uL   Immature Granulocytes 0 Not Estab. %   Immature Grans (Abs) 0.0 0.0 - 0.1 x10E3/uL  Lipid panel  Result Value Ref Range   Cholesterol, Total 209 (H) 100 - 199 mg/dL   Triglycerides 118 0 - 149 mg/dL   HDL 45 >39 mg/dL   VLDL Cholesterol Cal 21 5 - 40 mg/dL   LDL Chol Calc (NIH) 143 (H) 0 - 99 mg/dL   Chol/HDL Ratio 4.6 0.0 - 5.0 ratio  Thyroid Panel With TSH  Result Value Ref Range   TSH 3.500 0.450 - 4.500 uIU/mL   T4, Total 7.4 4.5 - 12.0 ug/dL   T3 Uptake Ratio 26 24 - 39 %   Free Thyroxine Index 1.9 1.2 - 4.9  Bayer DCA Hb A1c Waived  Result Value Ref Range   HB A1C (BAYER DCA - WAIVED) 4.9 4.8 - 5.6 %       Pertinent labs & imaging results that were available during my care of the patient were reviewed by me and considered in my medical decision making.  Assessment & Plan:  Kevin Brady was seen today for hypertension and anxiety.  Diagnoses and all orders for this visit:  Primary hypertension Uncontrolled. Will add HCTZ to losartan. DASH diet and exercise encouraged. Nurse visit in 2 weeks for BP check and BMP. Follow up in office in 3 months.  -     losartan-hydrochlorothiazide (HYZAAR) 50-12.5 MG tablet; Take 1 tablet by mouth daily.  GAD (generalized anxiety disorder) Has been unable to tolerate several medications in the past but feels anxiety  is worsening. Denies genesight testing. Will provide PRN vistaril for anxiety and trial sertraline. Follow up telephone visit in 2-3 weeks for reevaluation. Follow up in office in 3 months or sooner if warranted.  -     hydrOXYzine (VISTARIL) 25 MG capsule; Take 1 capsule (25 mg total) by mouth 3 (three) times daily as needed for anxiety. -     sertraline (ZOLOFT) 25 MG tablet; Take 1 tablet (25 mg total) by mouth at bedtime.     Continue all other maintenance medications.  Follow up plan: Return in about 3 months (around 10/28/2021), or if symptoms worsen or fail to improve, for HTN, GAD. 2 weeks telephone visit. 2-3 weeks nurse visit for BP and BMP.    Continue healthy lifestyle choices, including diet (rich in fruits, vegetables, and  lean proteins, and low in salt and simple carbohydrates) and exercise (at least 30 minutes of moderate physical activity daily).  Educational handout given for DASH diet   The above assessment and management plan was discussed with the patient. The patient verbalized understanding of and has agreed to the management plan. Patient is aware to call the clinic if they develop any new symptoms or if symptoms persist or worsen. Patient is aware when to return to the clinic for a follow-up visit. Patient educated on when it is appropriate to go to the emergency department.   Monia Pouch, FNP-C [facility] [phone]

## 2021-07-30 NOTE — Patient Instructions (Signed)

## 2021-08-02 ENCOUNTER — Telehealth: Payer: Self-pay | Admitting: *Deleted

## 2021-08-02 DIAGNOSIS — I10 Essential (primary) hypertension: Secondary | ICD-10-CM

## 2021-08-02 NOTE — Telephone Encounter (Signed)
Losartan Potassium-HCTZ 50-12.5MG  tablets pa STARTED    (Key: [id number]) Shows too soon to fill   [facility] has indicated that it is too soon to refill this medication at the pharmacy for your patient. If you need to renew an existing PA for your patient's medication, please reach out to [place] directly at [phone]  PA closed

## 2021-08-13 ENCOUNTER — Telehealth: Payer: Self-pay | Admitting: Family Medicine

## 2021-08-13 NOTE — Telephone Encounter (Signed)
Can increase the vistaril to 50 mg as needed. Continue BP medications and reduce salt intake.

## 2021-08-13 NOTE — Telephone Encounter (Signed)
Pt aware of provider feedback and voiced understanding. 

## 2021-10-28 ENCOUNTER — Ambulatory Visit: Payer: 59 | Admitting: Family Medicine

## 2021-10-28 ENCOUNTER — Encounter: Payer: Self-pay | Admitting: Family Medicine

## 2021-10-28 VITALS — BP 138/88 | HR 69 | Temp 98.8°F | Ht 71.0 in | Wt 180.0 lb

## 2021-10-28 DIAGNOSIS — I10 Essential (primary) hypertension: Secondary | ICD-10-CM | POA: Diagnosis not present

## 2021-10-28 DIAGNOSIS — M47812 Spondylosis without myelopathy or radiculopathy, cervical region: Secondary | ICD-10-CM

## 2021-10-28 DIAGNOSIS — F411 Generalized anxiety disorder: Secondary | ICD-10-CM | POA: Diagnosis not present

## 2021-10-28 MED ORDER — SERTRALINE HCL 50 MG PO TABS: 50.0000 mg | ORAL_TABLET | Freq: Every day | ORAL | 3 refills | Status: DC

## 2021-10-28 MED ORDER — HYDROXYZINE PAMOATE 25 MG PO CAPS: 50.0000 mg | ORAL_CAPSULE | Freq: Four times a day (QID) | ORAL | 3 refills | Status: DC | PRN

## 2021-10-28 NOTE — Progress Notes (Signed)
?  ? ?Subjective:  ?Patient ID: Kevin Brady, male    DOB: 07-25-1955, 66 y.o.   MRN: [id number] ? ?Patient Care Team: ?Baruch Gouty, FNP as PCP - General (Family Medicine)  ? ?Chief Complaint:  Medical Management of Chronic Issues ? ? ?HPI: ?Kevin Brady is a 66 y.o. male presenting on 10/28/2021 for Medical Management of Chronic Issues ? ? ?1. GAD (generalized anxiety disorder) ?Has been taking PRN vistaril with minimal relief of acute anxiety. Taking sertraline every night with some improvement in overall symptoms. No reported side effects from medications.  ? ?  10/28/2021  ? 10:44 AM 07/30/2021  ? 11:35 AM 07/16/2021  ? 10:24 AM 04/15/2021  ?  9:45 AM  ?GAD 7 : Generalized Anxiety Score  ?Nervous, Anxious, on Edge '2 1 1 1  '$ ?Control/stop worrying '1 1 1 2  '$ ?Worry too much - different things '1 1 1 2  '$ ?Trouble relaxing '2 2 2 2  '$ ?Restless 1 0 0 1  ?Easily annoyed or irritable '1 1 1 '$ 0  ?Afraid - awful might happen '1 1 1 1  '$ ?Total GAD 7 Score '9 7 7 9  '$ ?Anxiety Difficulty Somewhat difficult Somewhat difficult Somewhat difficult Somewhat difficult  ? ? ? ?  10/28/2021  ? 10:44 AM 07/30/2021  ? 11:34 AM 07/16/2021  ? 10:23 AM 04/15/2021  ?  9:46 AM 03/04/2021  ? 10:35 AM  ?Depression screen PHQ 2/9  ?Decreased Interest '1 1 1 1 1  '$ ?Down, Depressed, Hopeless '2 1 1 1 1  '$ ?PHQ - 2 Score '3 2 2 2 2  '$ ?Altered sleeping '2 2 2 2 1  '$ ?Tired, decreased energy '1 1 1 1 1  '$ ?Change in appetite '1 1 1 2 '$ 0  ?Feeling bad or failure about yourself  '1 1 1 1 1  '$ ?Trouble concentrating 0 '1 1 1 '$ 0  ?Moving slowly or fidgety/restless 0 0 0 0 0  ?Suicidal thoughts 0 0 0 0 0  ?PHQ-9 Score '8 8 8 9 5  '$ ?Difficult doing work/chores Somewhat difficult Somewhat difficult Somewhat difficult Somewhat difficult Somewhat difficult  ? ? ? ?2. Primary hypertension ?Complaint with meds - Yes ?Current Medications - Hyzaar 50-12.5 mg ?Checking BP at home ranging 130-170/70-90 ?Exercising Regularly - No ?Watching Salt intake - Yes ?Pertinent ROS:  ?Headache - No ?Fatigue -  No ?Visual Disturbances - No ?Chest pain - No ?Dyspnea - No ?Palpitations - No ?LE edema - No ?They report good compliance with medications and can restate their regimen by memory. No medication side effects. ? ?BP Readings from Last 3 Encounters:  ?10/28/21 138/88  ?07/30/21 (!) 157/88  ?07/16/21 (!) 196/93  ? ? ? ?3. Cervical arthritis ?Ongoing for several years. Has tried tylenol with minimal relief of symptoms. Has went to PT in the past with some relief of symptoms. Denies new injury or radiculopathy symptoms. No weakness or loss of function.  ?  ? ? ?Relevant past medical, surgical, family, and social history reviewed and updated as indicated.  ?Allergies and medications reviewed and updated. Data reviewed: Chart in Epic. ? ? ?Past Medical History:  ?Diagnosis Date  ? Arthritis   ? ? ?History reviewed. No pertinent surgical history. ? ?Social History  ? ?Socioeconomic History  ? Marital status: Married  ?  Spouse name: Not on file  ? Number of children: Not on file  ? Years of education: Not on file  ? Highest education level: Not on file  ?Occupational History  ?  Not on file  ?Tobacco Use  ? Smoking status: Former  ?  Packs/day: 0.50  ?  Types: Cigarettes  ?  Start date: 03/05/1975  ?  Quit date: 03/04/1977  ?  Years since quitting: 44.6  ? Smokeless tobacco: Never  ?Vaping Use  ? Vaping Use: Never used  ?Substance and Sexual Activity  ? Alcohol use: Yes  ?  Comment: rare  ? Drug use: No  ? Sexual activity: Not on file  ?Other Topics Concern  ? Not on file  ?Social History Narrative  ? Not on file  ? ?Social Determinants of Health  ? ?Financial Resource Strain: Not on file  ?Food Insecurity: Not on file  ?Transportation Needs: Not on file  ?Physical Activity: Not on file  ?Stress: Not on file  ?Social Connections: Not on file  ?Intimate Partner Violence: Not on file  ? ? ?Outpatient Encounter Medications as of 10/28/2021  ?Medication Sig  ? cetirizine (ZYRTEC) 10 MG tablet Take 1 tablet (10 mg total) by mouth  daily.  ? fluticasone (FLONASE) 50 MCG/ACT nasal spray Place 2 sprays into both nostrils daily.  ? losartan-hydrochlorothiazide (HYZAAR) 50-12.5 MG tablet Take 1 tablet by mouth daily.  ? sertraline (ZOLOFT) 50 MG tablet Take 1 tablet (50 mg total) by mouth daily.  ? [DISCONTINUED] hydrOXYzine (VISTARIL) 25 MG capsule Take 1 capsule (25 mg total) by mouth 3 (three) times daily as needed for anxiety.  ? [DISCONTINUED] sertraline (ZOLOFT) 25 MG tablet Take 1 tablet (25 mg total) by mouth at bedtime.  ? hydrOXYzine (VISTARIL) 25 MG capsule Take 2 capsules (50 mg total) by mouth every 6 (six) hours as needed for anxiety.  ? ?No facility-administered encounter medications on file as of 10/28/2021.  ? ? ?Allergies  ?Allergen Reactions  ? Penicillins Rash  ? Xyzal [Levocetirizine Dihydrochloride]   ?  Fatigue ?  ? Nexium [Esomeprazole Magnesium] Other (See Comments)  ?  constipation  ? Paxil [Paroxetine Hcl] Other (See Comments)  ?  fatigue  ? ? ?Review of Systems  ?Constitutional:  Positive for activity change, appetite change and fatigue. Negative for chills, diaphoresis, fever and unexpected weight change.  ?Eyes:  Negative for photophobia and visual disturbance.  ?Respiratory:  Negative for cough and shortness of breath.   ?Cardiovascular:  Negative for chest pain, palpitations and leg swelling.  ?Gastrointestinal:  Negative for abdominal pain, diarrhea, nausea and vomiting.  ?Endocrine: Negative for polydipsia, polyphagia and polyuria.  ?Genitourinary:  Negative for decreased urine volume and difficulty urinating.  ?Musculoskeletal:  Positive for neck pain. Negative for arthralgias, back pain, gait problem, joint swelling, myalgias and neck stiffness.  ?Neurological:  Negative for dizziness, tremors, seizures, syncope, facial asymmetry, speech difficulty, weakness, light-headedness, numbness and headaches.  ?Psychiatric/Behavioral:  Positive for agitation, decreased concentration and sleep disturbance. Negative for  behavioral problems, confusion, dysphoric mood, hallucinations, self-injury and suicidal ideas. The patient is nervous/anxious. The patient is not hyperactive.   ?All other systems reviewed and are negative. ? ?   ? ?Objective:  ?BP 138/88 (BP Location: Right Arm, Cuff Size: Normal)   Pulse 69   Temp 98.8 ?F (37.1 ?C)   Ht '5\' 11"'$  (1.803 m)   Wt 180 lb (81.6 kg)   SpO2 97%   BMI 25.10 kg/m?   ? ?Wt Readings from Last 3 Encounters:  ?10/28/21 180 lb (81.6 kg)  ?07/30/21 179 lb (81.2 kg)  ?07/16/21 182 lb (82.6 kg)  ? ? ?Physical Exam ?Vitals and nursing note reviewed.  ?Constitutional:   ?  General: He is not in acute distress. ?   Appearance: Normal appearance. He is well-developed and well-groomed. He is not ill-appearing, toxic-appearing or diaphoretic.  ?HENT:  ?   Head: Normocephalic and atraumatic.  ?   Jaw: There is normal jaw occlusion.  ?   Right Ear: Hearing, tympanic membrane, ear canal and external ear normal.  ?   Left Ear: Hearing, tympanic membrane, ear canal and external ear normal.  ?   Nose: Nose normal.  ?   Mouth/Throat:  ?   Lips: Pink.  ?   Mouth: Mucous membranes are moist.  ?   Pharynx: Oropharynx is clear. Uvula midline.  ?Eyes:  ?   General: Lids are normal.  ?   Extraocular Movements: Extraocular movements intact.  ?   Conjunctiva/sclera: Conjunctivae normal.  ?   Pupils: Pupils are equal, round, and reactive to light.  ?Neck:  ?   Thyroid: No thyroid mass, thyromegaly or thyroid tenderness.  ?   Vascular: No carotid bruit or JVD.  ?   Trachea: Trachea and phonation normal.  ?Cardiovascular:  ?   Rate and Rhythm: Normal rate and regular rhythm.  ?   Chest Wall: PMI is not displaced.  ?   Pulses: Normal pulses.  ?   Heart sounds: Normal heart sounds. No murmur heard. ?  No friction rub. No gallop.  ?Pulmonary:  ?   Effort: Pulmonary effort is normal. No respiratory distress.  ?   Breath sounds: Normal breath sounds. No wheezing.  ?Abdominal:  ?   General: Bowel sounds are normal. There  is no distension or abdominal bruit.  ?   Palpations: Abdomen is soft. There is no hepatomegaly or splenomegaly.  ?   Tenderness: There is no abdominal tenderness. There is no right CVA tenderness or left CVA te

## 2021-10-28 NOTE — Patient Instructions (Signed)

## 2022-03-01 ENCOUNTER — Ambulatory Visit (INDEPENDENT_AMBULATORY_CARE_PROVIDER_SITE_OTHER): Payer: No Typology Code available for payment source | Admitting: Family Medicine

## 2022-03-01 ENCOUNTER — Encounter: Payer: Self-pay | Admitting: Family Medicine

## 2022-03-01 ENCOUNTER — Other Ambulatory Visit: Payer: Self-pay | Admitting: Family Medicine

## 2022-03-01 VITALS — BP 129/84 | HR 86 | Temp 98.9°F | Ht 71.0 in | Wt 141.0 lb

## 2022-03-01 DIAGNOSIS — F5102 Adjustment insomnia: Secondary | ICD-10-CM | POA: Diagnosis not present

## 2022-03-01 DIAGNOSIS — F411 Generalized anxiety disorder: Secondary | ICD-10-CM

## 2022-03-01 DIAGNOSIS — F4321 Adjustment disorder with depressed mood: Secondary | ICD-10-CM

## 2022-03-01 DIAGNOSIS — I10 Essential (primary) hypertension: Secondary | ICD-10-CM | POA: Diagnosis not present

## 2022-03-01 DIAGNOSIS — E559 Vitamin D deficiency, unspecified: Secondary | ICD-10-CM

## 2022-03-01 DIAGNOSIS — E785 Hyperlipidemia, unspecified: Secondary | ICD-10-CM | POA: Diagnosis not present

## 2022-03-01 MED ORDER — HYDROXYZINE PAMOATE 25 MG PO CAPS: 50.0000 mg | ORAL_CAPSULE | Freq: Four times a day (QID) | ORAL | 3 refills | Status: DC | PRN

## 2022-03-01 MED ORDER — TRAZODONE HCL 50 MG PO TABS: 50.0000 mg | ORAL_TABLET | Freq: Every day | ORAL | 0 refills | Status: DC

## 2022-03-01 MED ORDER — LOSARTAN POTASSIUM-HCTZ 50-12.5 MG PO TABS: 1.0000 | ORAL_TABLET | Freq: Every day | ORAL | 3 refills | Status: DC

## 2022-03-01 NOTE — Progress Notes (Signed)
Subjective:  Patient ID: Kevin Brady, male    DOB: [birth year], 66 y.o.   MRN: [id number]  Patient Care Team: Baruch Gouty, FNP as PCP - General (Family Medicine)   Chief Complaint:  Medical Management of Chronic Issues, Hypertension, Hyperlipidemia, Depression, and Anxiety   HPI: Kevin Brady is a 66 y.o. male presenting on 03/01/2022 for Medical Management of Chronic Issues, Hypertension, Hyperlipidemia, Depression, and Anxiety   Pt presents today for management of chronic medical conditions. He reports he has had a rough few months due to his wife of 30 years leaving him. He has had significant anxiety, depression, and insomnia. Denies SI or HI. He lost 40 pounds due to decreased appetite. Reports things are starting to improve but are still present.  He has Vit D deficiency and is on repletion therapy. Denies arthralgias or difficulty walking. No recent fractures.   Hypertension This is a chronic problem. The problem is controlled. Associated symptoms include anxiety and malaise/fatigue. Pertinent negatives include no blurred vision, chest pain, headaches, neck pain, orthopnea, palpitations, peripheral edema, PND, shortness of breath or sweats. There are no associated agents to hypertension. Risk factors for coronary artery disease include dyslipidemia, male gender and stress. Past treatments include angiotensin blockers and diuretics. The current treatment provides significant improvement. Compliance problems include exercise and diet.  There is no history of angina, kidney disease, CAD/MI, CVA, heart failure, left ventricular hypertrophy, PVD or retinopathy. There is no history of chronic renal disease.  Hyperlipidemia This is a chronic problem. The problem is controlled. Recent lipid tests were reviewed and are high. He has no history of chronic renal disease, diabetes, hypothyroidism, liver disease, obesity or nephrotic syndrome. Factors aggravating his hyperlipidemia include  thiazides. Pertinent negatives include no chest pain, focal sensory loss, focal weakness, leg pain, myalgias or shortness of breath. Current antihyperlipidemic treatment includes diet change. The current treatment provides mild improvement of lipids. Compliance problems include adherence to diet and adherence to exercise.  Risk factors for coronary artery disease include dyslipidemia, male sex, hypertension and stress.  Depression        This is a recurrent problem.  The problem occurs constantly.  The problem has been waxing and waning since onset.  Associated symptoms include decreased concentration, fatigue, helplessness, hopelessness, insomnia, irritable, restlessness, decreased interest, appetite change and sad.  Associated symptoms include no body aches, no myalgias, no headaches, no indigestion and no suicidal ideas.     The symptoms are aggravated by family issues.  Past treatments include SSRIs - Selective serotonin reuptake inhibitors and other medications.  Compliance with treatment is good.  Previous treatment provided mild relief.  Risk factors include marital problems and major life event.   Past medical history includes anxiety.     Pertinent negatives include no hypothyroidism. Anxiety Presents for follow-up visit. Symptoms include decreased concentration, depressed mood, excessive worry, insomnia, irritability, malaise, nervous/anxious behavior and restlessness. Patient reports no chest pain, compulsions, confusion, dizziness, dry mouth, feeling of choking, hyperventilation, impotence, muscle tension, nausea, obsessions, palpitations, panic, shortness of breath or suicidal ideas. Symptoms occur constantly. The severity of symptoms is moderate. The quality of sleep is poor. Nighttime awakenings: several.   Compliance with medications is 76-100%.      03/01/2022   10:28 AM 10/28/2021   10:44 AM 07/30/2021   11:35 AM 07/16/2021   10:24 AM  GAD 7 : Generalized Anxiety Score  Nervous, Anxious,  on Edge '3 2 1 1  ' Control/stop worrying 3  '1 1 1  ' Worry too much - different things '2 1 1 1  ' Trouble relaxing '2 2 2 2  ' Restless 1 1 0 0  Easily annoyed or irritable 0 '1 1 1  ' Afraid - awful might happen '2 1 1 1  ' Total GAD 7 Score '13 9 7 7  ' Anxiety Difficulty  Somewhat difficult Somewhat difficult Somewhat difficult       03/01/2022   10:23 AM 10/28/2021   10:44 AM 07/30/2021   11:34 AM 07/16/2021   10:23 AM 04/15/2021    9:46 AM  Depression screen PHQ 2/9  Decreased Interest '2 1 1 1 1  ' Down, Depressed, Hopeless '2 2 1 1 1  ' PHQ - 2 Score '4 3 2 2 2  ' Altered sleeping '2 2 2 2 2  ' Tired, decreased energy '2 1 1 1 1  ' Change in appetite '2 1 1 1 2  ' Feeling bad or failure about yourself  '3 1 1 1 1  ' Trouble concentrating 2 0 '1 1 1  ' Moving slowly or fidgety/restless 0 0 0 0 0  Suicidal thoughts 0 0 0 0 0  PHQ-9 Score '15 8 8 8 9  ' Difficult doing work/chores  Somewhat difficult Somewhat difficult Somewhat difficult Somewhat difficult      Relevant past medical, surgical, family, and social history reviewed and updated as indicated.  Allergies and medications reviewed and updated. Data reviewed: Chart in Epic.   Past Medical History:  Diagnosis Date   Arthritis     History reviewed. No pertinent surgical history.  Social History   Socioeconomic History   Marital status: Married    Spouse name: Not on file   Number of children: Not on file   Years of education: Not on file   Highest education level: Not on file  Occupational History   Not on file  Tobacco Use   Smoking status: Former    Packs/day: 0.50    Types: Cigarettes    Start date: 03/05/1975    Quit date: 03/04/1977    Years since quitting: 45.0   Smokeless tobacco: Never  Vaping Use   Vaping Use: Never used  Substance and Sexual Activity   Alcohol use: Yes    Comment: rare   Drug use: No   Sexual activity: Not on file  Other Topics Concern   Not on file  Social History Narrative   Not on file   Social  Determinants of Health   Financial Resource Strain: Not on file  Food Insecurity: Not on file  Transportation Needs: Not on file  Physical Activity: Not on file  Stress: Not on file  Social Connections: Not on file  Intimate Partner Violence: Not on file    Outpatient Encounter Medications as of 03/01/2022  Medication Sig   cetirizine (ZYRTEC) 10 MG tablet Take 1 tablet (10 mg total) by mouth daily.   fluticasone (FLONASE) 50 MCG/ACT nasal spray Place 2 sprays into both nostrils daily.   hydrOXYzine (VISTARIL) 25 MG capsule Take 2 capsules (50 mg total) by mouth every 6 (six) hours as needed for anxiety.   losartan-hydrochlorothiazide (HYZAAR) 50-12.5 MG tablet Take 1 tablet by mouth daily.   sertraline (ZOLOFT) 50 MG tablet Take 1 tablet (50 mg total) by mouth daily.   traZODone (DESYREL) 50 MG tablet Take 1 tablet (50 mg total) by mouth at bedtime.   [DISCONTINUED] hydrOXYzine (VISTARIL) 25 MG capsule Take 2 capsules (50 mg total) by mouth every 6 (six) hours as needed for  anxiety.   [DISCONTINUED] losartan-hydrochlorothiazide (HYZAAR) 50-12.5 MG tablet Take 1 tablet by mouth daily.   No facility-administered encounter medications on file as of 03/01/2022.    Allergies  Allergen Reactions   Penicillins Rash   Xyzal [Levocetirizine Dihydrochloride]     Fatigue    Nexium [Esomeprazole Magnesium] Other (See Comments)    constipation   Paxil [Paroxetine Hcl] Other (See Comments)    fatigue    Review of Systems  Constitutional:  Positive for activity change, appetite change, fatigue, irritability, malaise/fatigue and unexpected weight change. Negative for chills, diaphoresis and fever.  Eyes:  Negative for blurred vision, photophobia and visual disturbance.  Respiratory:  Negative for cough, chest tightness and shortness of breath.   Cardiovascular:  Negative for chest pain, palpitations, orthopnea, leg swelling and PND.  Gastrointestinal:  Negative for abdominal pain,  constipation, diarrhea, nausea and vomiting.  Endocrine: Negative for cold intolerance, heat intolerance, polydipsia, polyphagia and polyuria.  Genitourinary:  Negative for decreased urine volume, difficulty urinating and impotence.  Musculoskeletal:  Negative for myalgias and neck pain.  Neurological:  Negative for dizziness, tremors, focal weakness, seizures, syncope, facial asymmetry, speech difficulty, weakness, light-headedness, numbness and headaches.  Psychiatric/Behavioral:  Positive for agitation, decreased concentration, depression, dysphoric mood and sleep disturbance. Negative for behavioral problems, confusion, hallucinations, self-injury and suicidal ideas. The patient is nervous/anxious and has insomnia. The patient is not hyperactive.   All other systems reviewed and are negative.       Objective:  BP 129/84   Pulse 86   Temp 98.9 F (37.2 C)   Ht '5\' 11"'  (1.803 m)   Wt 141 lb (64 kg)   SpO2 99%   BMI 19.67 kg/m    Wt Readings from Last 3 Encounters:  03/01/22 141 lb (64 kg)  10/28/21 180 lb (81.6 kg)  07/30/21 179 lb (81.2 kg)    Physical Exam Vitals and nursing note reviewed.  Constitutional:      General: He is irritable. He is not in acute distress.    Appearance: He is not ill-appearing, toxic-appearing or diaphoretic.  HENT:     Head: Normocephalic and atraumatic.     Mouth/Throat:     Mouth: Mucous membranes are moist.  Eyes:     Pupils: Pupils are equal, round, and reactive to light.  Cardiovascular:     Rate and Rhythm: Normal rate and regular rhythm.     Heart sounds: Normal heart sounds. No murmur heard.    No friction rub. No gallop.  Pulmonary:     Effort: Pulmonary effort is normal.     Breath sounds: Normal breath sounds.  Musculoskeletal:     Right lower leg: No edema.     Left lower leg: No edema.  Skin:    General: Skin is warm and dry.     Capillary Refill: Capillary refill takes less than 2 seconds.  Neurological:     General: No  focal deficit present.     Mental Status: He is alert and oriented to person, place, and time.  Psychiatric:        Attention and Perception: Attention and perception normal.        Mood and Affect: Mood is anxious.        Speech: Speech is rapid and pressured.        Behavior: Behavior is hyperactive. Behavior is cooperative.        Thought Content: Thought content normal.        Cognition and Memory:  Cognition and memory normal.        Judgment: Judgment normal.     Results for orders placed or performed in visit on 07/16/21  [facility]  Result Value Ref Range   Glucose 92 70 - 99 mg/dL   BUN 13 8 - 27 mg/dL   Creatinine, Ser 0.86 0.76 - 1.27 mg/dL   eGFR 97 >59 mL/min/1.73   BUN/Creatinine Ratio 15 10 - 24   Sodium 141 134 - 144 mmol/L   Potassium 4.5 3.5 - 5.2 mmol/L   Chloride 103 96 - 106 mmol/L   CO2 25 20 - 29 mmol/L   Calcium 9.2 8.6 - 10.2 mg/dL  CBC with Differential/Platelet  Result Value Ref Range   WBC 5.4 3.4 - 10.8 x10E3/uL   RBC 5.02 4.14 - 5.80 x10E6/uL   Hemoglobin 15.7 13.0 - 17.7 g/dL   Hematocrit 46.2 37.5 - 51.0 %   MCV 92 79 - 97 fL   MCH 31.3 26.6 - 33.0 pg   MCHC 34.0 31.5 - 35.7 g/dL   RDW 12.5 11.6 - 15.4 %   Platelets 294 150 - 450 x10E3/uL   Neutrophils 62 Not Estab. %   Lymphs 27 Not Estab. %   Monocytes 8 Not Estab. %   Eos 2 Not Estab. %   Basos 1 Not Estab. %   Neutrophils Absolute 3.4 1.4 - 7.0 x10E3/uL   Lymphocytes Absolute 1.5 0.7 - 3.1 x10E3/uL   Monocytes Absolute 0.4 0.1 - 0.9 x10E3/uL   EOS (ABSOLUTE) 0.1 0.0 - 0.4 x10E3/uL   Basophils Absolute 0.1 0.0 - 0.2 x10E3/uL   Immature Granulocytes 0 Not Estab. %   Immature Grans (Abs) 0.0 0.0 - 0.1 x10E3/uL  Lipid panel  Result Value Ref Range   Cholesterol, Total 209 (H) 100 - 199 mg/dL   Triglycerides 118 0 - 149 mg/dL   HDL 45 >39 mg/dL   VLDL Cholesterol Cal 21 5 - 40 mg/dL   LDL Chol Calc (NIH) 143 (H) 0 - 99 mg/dL   Chol/HDL Ratio 4.6 0.0 - 5.0 ratio  Thyroid Panel With  TSH  Result Value Ref Range   TSH 3.500 0.450 - 4.500 uIU/mL   T4, Total 7.4 4.5 - 12.0 ug/dL   T3 Uptake Ratio 26 24 - 39 %   Free Thyroxine Index 1.9 1.2 - 4.9  Bayer DCA Hb A1c Waived  Result Value Ref Range   HB A1C (BAYER DCA - WAIVED) 4.9 4.8 - 5.6 %       Pertinent labs & imaging results that were available during my care of the patient were reviewed by me and considered in my medical decision making.  Assessment & Plan:  [place]den was seen today for medical management of chronic issues, hypertension, hyperlipidemia, depression and anxiety.  Diagnoses and all orders for this visit:  Primary hypertension BP well controlled. Changes were not made in regimen today. Goal BP is 130/80. Pt aware to report any persistent high or low readings. DASH diet and exercise encouraged. Exercise at least 150 minutes per week and increase as tolerated. Goal BMI > 25. Stress management encouraged. Avoid nicotine and tobacco product use. Avoid excessive alcohol and NSAID's. Avoid more than 2000 mg of sodium daily. Medications as prescribed. Follow up as scheduled.  -     Thyroid Panel With TSH -     Lipid panel -     CBC with Differential/Platelet -     CMP14+EGFR -     losartan-hydrochlorothiazide (HYZAAR)  50-12.5 MG tablet; Take 1 tablet by mouth daily.  Hyperlipidemia LDL goal <100 Diet encouraged - increase intake of fresh fruits and vegetables, increase intake of lean proteins. Bake, broil, or grill foods. Avoid fried, greasy, and fatty foods. Avoid fast foods. Increase intake of fiber-rich whole grains. Exercise encouraged - at least 150 minutes per week and advance as tolerated. Labs pending, will discuss medications if lipids remain elevated.  -     Lipid panel -     CMP14+EGFR  Vitamin D deficiency Labs pending. Continue repletion therapy. If indicated, will change repletion dosage. Eat foods rich in Vit D including milk, orange juice, yogurt with vitamin D added, salmon or mackerel, canned  tuna fish, cereals with vitamin D added, and cod liver oil. Get out in the sun but make sure to wear at least SPF 30 sunscreen.  -     VITAMIN D 25 Hydroxy (Vit-D Deficiency, Fractures)  GAD (generalized anxiety disorder) Situational depression Adjustment insomnia Wife left him after 74 years of marriage. Caused significant increase in anxiety, depression, and insomnia. Will add trazodone for insomnia. Referral to psychiatry placed. [facility] resources provided to pt along with crisis intervention hotlines.  -     Thyroid Panel With TSH -     hydrOXYzine (VISTARIL) 25 MG capsule; Take 2 capsules (50 mg total) by mouth every 6 (six) hours as needed for anxiety. -     Ambulatory referral to Psychiatry  -     traZODone (DESYREL) 50 MG tablet; Take 1 tablet (50 mg total) by mouth at bedtime.   Continue all other maintenance medications.  Follow up plan: Return in about 6 months (around 09/01/2022), or if symptoms worsen or fail to improve, for CPE.   Continue healthy lifestyle choices, including diet (rich in fruits, vegetables, and lean proteins, and low in salt and simple carbohydrates) and exercise (at least 30 minutes of moderate physical activity daily).  Educational handout given for [facility] resources  The above assessment and management plan was discussed with the patient. The patient verbalized understanding of and has agreed to the management plan. Patient is aware to call the clinic if they develop any new symptoms or if symptoms persist or worsen. Patient is aware when to return to the clinic for a follow-up visit. Patient educated on when it is appropriate to go to the emergency department.   Monia Pouch, FNP-C [facility] [phone]

## 2022-03-01 NOTE — Patient Instructions (Addendum)
Your provider wants you to schedule an appointment with a Psychologist/Psychiatrist. The following list of offices requires the patient to call and make their own appointment, as there is information they need that only you can provide. Please feel free to choose form the following providers:  [facility]   [phone] Crisis Recovery in [place] [phone]  [facility]  [phone]   [address] [place], [place]  (Scheduled through [facility]) Must call and do an interview for appointment. Sees Children / Accepts Medicaid  [facility]    [phone]  [address], [address]    [place], [place]       [facility]  [phone] [address] [place], [place]  Evaluates for Autism but does not treat it Sees Children / Accepts Medicaid  [facility]    [phone] [address], [address]   [place], [place] Medication management, substance abuse, bipolar, grief, family, marriage, OCD, anxiety, PTSD Sees children / Accepts Medicaid  [facility]    [phone] [address] [place], [place] Sees children / Accepts Medicaid  [facility]  [phone] [address] [place], [place]   Dr Akinlayo     [phone] [address] [place], [place]  Sees ADD & ADHD for treatment Accepts Medicaid  [facility]  [phone] [address] [place], [place] Evaluates for Autism Accepts Medicaid  [facility]  [phone] [address] [place], [place]  Does Adult ADD evaluations Does not accept Medicaid  [facility]   [phone] [address]   , [place] Uses animal therapy  Sees children as young as 3 years old Accepts Medicaid  [facility]     [phone]    [address]  ,  [address] Sees children Accepts Medicaid  

## 2022-03-02 LAB — THYROID PANEL WITH TSH
Free Thyroxine Index: 2.1 (ref 1.2–4.9)
T3 Uptake Ratio: 29 % (ref 24–39)
T4, Total: 7.3 ug/dL (ref 4.5–12.0)
TSH: 2.2 u[IU]/mL (ref 0.450–4.500)

## 2022-03-02 LAB — CBC WITH DIFFERENTIAL/PLATELET
Basophils Absolute: 0.1 10*3/uL (ref 0.0–0.2)
Basos: 1 %
EOS (ABSOLUTE): 0 10*3/uL (ref 0.0–0.4)
Eos: 0 %
Hematocrit: 46.1 % (ref 37.5–51.0)
Hemoglobin: 15.2 g/dL (ref 13.0–17.7)
Immature Grans (Abs): 0 10*3/uL (ref 0.0–0.1)
Immature Granulocytes: 0 %
Lymphocytes Absolute: 1 10*3/uL (ref 0.7–3.1)
Lymphs: 17 %
MCH: 30.6 pg (ref 26.6–33.0)
MCHC: 33 g/dL (ref 31.5–35.7)
MCV: 93 fL (ref 79–97)
Monocytes Absolute: 0.4 10*3/uL (ref 0.1–0.9)
Monocytes: 7 %
Neutrophils Absolute: 4.4 10*3/uL (ref 1.4–7.0)
Neutrophils: 75 %
Platelets: 354 10*3/uL (ref 150–450)
RBC: 4.97 x10E6/uL (ref 4.14–5.80)
RDW: 12.8 % (ref 11.6–15.4)
WBC: 6 10*3/uL (ref 3.4–10.8)

## 2022-03-02 LAB — LIPID PANEL
Chol/HDL Ratio: 3.7 ratio (ref 0.0–5.0)
Cholesterol, Total: 168 mg/dL (ref 100–199)
HDL: 45 mg/dL (ref >39)
LDL Chol Calc (NIH): 103 mg/dL — ABNORMAL HIGH (ref 0–99)
Triglycerides: 109 mg/dL (ref 0–149)
VLDL Cholesterol Cal: 20 mg/dL (ref 5–40)

## 2022-03-02 LAB — CMP14+EGFR
ALT: 20 IU/L (ref 0–44)
AST: 21 IU/L (ref 0–40)
Albumin/Globulin Ratio: 2 (ref 1.2–2.2)
Albumin: 4.6 g/dL (ref 3.9–4.9)
Alkaline Phosphatase: 117 IU/L (ref 44–121)
BUN/Creatinine Ratio: 11 (ref 10–24)
BUN: 13 mg/dL (ref 8–27)
Bilirubin Total: 0.5 mg/dL (ref 0.0–1.2)
CO2: 25 mmol/L (ref 20–29)
Calcium: 9.8 mg/dL (ref 8.6–10.2)
Chloride: 100 mmol/L (ref 96–106)
Creatinine, Ser: 1.19 mg/dL (ref 0.76–1.27)
Globulin, Total: 2.3 g/dL (ref 1.5–4.5)
Glucose: 104 mg/dL — ABNORMAL HIGH (ref 70–99)
Potassium: 4.5 mmol/L (ref 3.5–5.2)
Sodium: 140 mmol/L (ref 134–144)
Total Protein: 6.9 g/dL (ref 6.0–8.5)
eGFR: 68 mL/min/{1.73_m2} (ref >59)

## 2022-03-02 LAB — VITAMIN D 25 HYDROXY (VIT D DEFICIENCY, FRACTURES): Vit D, 25-Hydroxy: 30.1 ng/mL (ref 30.0–100.0)

## 2022-03-21 ENCOUNTER — Other Ambulatory Visit: Payer: Self-pay | Admitting: *Deleted

## 2022-03-21 DIAGNOSIS — I10 Essential (primary) hypertension: Secondary | ICD-10-CM

## 2022-03-21 MED ORDER — LOSARTAN POTASSIUM-HCTZ 50-12.5 MG PO TABS: 1.0000 | ORAL_TABLET | Freq: Every day | ORAL | 2 refills | Status: DC

## 2022-03-21 NOTE — Telephone Encounter (Signed)
100 day supply sent to local pharmacy & fax returned to [facility] w/ this information

## 2022-03-31 ENCOUNTER — Other Ambulatory Visit: Payer: Self-pay | Admitting: Family Medicine

## 2022-03-31 DIAGNOSIS — F5102 Adjustment insomnia: Secondary | ICD-10-CM

## 2022-03-31 DIAGNOSIS — J301 Allergic rhinitis due to pollen: Secondary | ICD-10-CM

## 2022-04-28 DIAGNOSIS — F17211 Nicotine dependence, cigarettes, in remission: Secondary | ICD-10-CM | POA: Diagnosis not present

## 2022-04-28 DIAGNOSIS — I10 Essential (primary) hypertension: Secondary | ICD-10-CM | POA: Diagnosis not present

## 2022-04-28 DIAGNOSIS — Z681 Body mass index (BMI) 19 or less, adult: Secondary | ICD-10-CM | POA: Diagnosis not present

## 2022-04-28 DIAGNOSIS — F419 Anxiety disorder, unspecified: Secondary | ICD-10-CM | POA: Diagnosis not present

## 2022-04-28 DIAGNOSIS — Z008 Encounter for other general examination: Secondary | ICD-10-CM | POA: Diagnosis not present

## 2022-07-13 ENCOUNTER — Other Ambulatory Visit: Payer: Self-pay | Admitting: Family Medicine

## 2022-07-13 DIAGNOSIS — J301 Allergic rhinitis due to pollen: Secondary | ICD-10-CM

## 2022-09-01 ENCOUNTER — Encounter: Payer: Self-pay | Admitting: Radiology

## 2022-09-01 ENCOUNTER — Encounter: Payer: Self-pay | Admitting: Family Medicine

## 2022-09-01 ENCOUNTER — Ambulatory Visit: Payer: No Typology Code available for payment source | Admitting: Family Medicine

## 2022-09-01 VITALS — BP 134/76 | HR 61 | Temp 97.1°F | Ht 71.0 in | Wt 133.2 lb

## 2022-09-01 DIAGNOSIS — F411 Generalized anxiety disorder: Secondary | ICD-10-CM | POA: Diagnosis not present

## 2022-09-01 DIAGNOSIS — Z1211 Encounter for screening for malignant neoplasm of colon: Secondary | ICD-10-CM

## 2022-09-01 DIAGNOSIS — Z0000 Encounter for general adult medical examination without abnormal findings: Secondary | ICD-10-CM

## 2022-09-01 DIAGNOSIS — E559 Vitamin D deficiency, unspecified: Secondary | ICD-10-CM

## 2022-09-01 DIAGNOSIS — Z136 Encounter for screening for cardiovascular disorders: Secondary | ICD-10-CM | POA: Diagnosis not present

## 2022-09-01 DIAGNOSIS — F5102 Adjustment insomnia: Secondary | ICD-10-CM

## 2022-09-01 DIAGNOSIS — Z0001 Encounter for general adult medical examination with abnormal findings: Secondary | ICD-10-CM | POA: Diagnosis not present

## 2022-09-01 DIAGNOSIS — R739 Hyperglycemia, unspecified: Secondary | ICD-10-CM | POA: Diagnosis not present

## 2022-09-01 DIAGNOSIS — J301 Allergic rhinitis due to pollen: Secondary | ICD-10-CM | POA: Diagnosis not present

## 2022-09-01 DIAGNOSIS — Z125 Encounter for screening for malignant neoplasm of prostate: Secondary | ICD-10-CM

## 2022-09-01 DIAGNOSIS — Z1159 Encounter for screening for other viral diseases: Secondary | ICD-10-CM

## 2022-09-01 DIAGNOSIS — Z114 Encounter for screening for human immunodeficiency virus [HIV]: Secondary | ICD-10-CM

## 2022-09-01 MED ORDER — AZELASTINE HCL 0.1 % NA SOLN: 2.0000 | Freq: Two times a day (BID) | NASAL | 12 refills | Status: DC

## 2022-09-01 MED ORDER — TRAZODONE HCL 50 MG PO TABS: 50.0000 mg | ORAL_TABLET | Freq: Every day | ORAL | 1 refills | Status: DC

## 2022-09-01 MED ORDER — SERTRALINE HCL 50 MG PO TABS: 50.0000 mg | ORAL_TABLET | Freq: Every day | ORAL | 1 refills | Status: DC

## 2022-09-01 NOTE — Progress Notes (Signed)
Complete physical exam  Patient: Kevin Brady   DOB: January 25, 1956   67 y.o. Male  MRN: [id number]  Subjective:    Chief Complaint  Patient presents with   Annual Exam    Kevin Brady is a 67 y.o. male who presents today for a complete physical exam. He reports consuming a general diet. The patient does not participate in regular exercise at present. He generally feels fairly well. He reports sleeping poorly. He does have additional problems to discuss today.   GAD (generalized anxiety disorder Adjustment insomnia Feels he is improving as he has not needed PRN vistaril. Still has anxiety when out in public. Denies SI or HI.     09/01/2022   10:14 AM 03/01/2022   10:28 AM 10/28/2021   10:44 AM 07/30/2021   11:35 AM  GAD 7 : Generalized Anxiety Score  Nervous, Anxious, on Edge '2 3 2 1  '$ Control/stop worrying '2 3 1 1  '$ Worry too much - different things '2 2 1 1  '$ Trouble relaxing '2 2 2 2  '$ Restless '1 1 1 '$ 0  Easily annoyed or irritable 0 0 1 1  Afraid - awful might happen '2 2 1 1  '$ Total GAD 7 Score '11 13 9 7  '$ Anxiety Difficulty   Somewhat difficult Somewhat difficult       09/01/2022   10:13 AM 03/01/2022   10:23 AM 10/28/2021   10:44 AM 07/30/2021   11:34 AM 07/16/2021   10:23 AM  Depression screen PHQ 2/9  Decreased Interest '1 2 1 1 1  '$ Down, Depressed, Hopeless '2 2 2 1 1  '$ PHQ - 2 Score '3 4 3 2 2  '$ Altered sleeping '2 2 2 2 2  '$ Tired, decreased energy '2 2 1 1 1  '$ Change in appetite 0 '2 1 1 1  '$ Feeling bad or failure about yourself  '1 3 1 1 1  '$ Trouble concentrating 1 2 0 1 1  Moving slowly or fidgety/restless 0 0 0 0 0  Suicidal thoughts 0 0 0 0 0  PHQ-9 Score '9 15 8 8 8  '$ Difficult doing work/chores   Somewhat difficult Somewhat difficult Somewhat difficult     Seasonal allergic rhinitis due to pollen Has been using Flonase for several months and feels this is not working as she continues to have rhinorrhea and postnasal drainage. No other associated symptoms.  Vitamin D  deficiency Currently not on repletion therapy. Denies trouble walking, arthralgias, or recent fractures.    Most recent fall risk assessment:    10/28/2021   10:45 AM  Fall Risk   Falls in the past year? 0     Most recent depression screenings:    09/01/2022   10:13 AM 03/01/2022   10:23 AM  PHQ 2/9 Scores  PHQ - 2 Score 3 4  PHQ- 9 Score 9 15    Vision:Within last year and Dental: No current dental problems and Receives regular dental care  Patient Active Problem List   Diagnosis Date Noted   Adjustment insomnia 09/01/2022   Situational depression 03/01/2022   Cervical arthritis 10/28/2021   Seasonal allergic rhinitis due to pollen 07/16/2021   Primary hypertension 02/23/2021   GAD (generalized anxiety disorder) 02/23/2021   Vitamin D deficiency 02/23/2021   Hyperlipidemia LDL goal <100 11/26/2016   INTERNAL HEMORRHOIDS 09/11/2007   GERD 09/11/2007   Past Medical History:  Diagnosis Date   Arthritis    History reviewed. No pertinent surgical history. Social History  Tobacco Use   Smoking status: Former    Packs/day: 0.50    Types: Cigarettes    Start date: 03/05/1975    Quit date: 03/04/1977    Years since quitting: 45.5   Smokeless tobacco: Never  Vaping Use   Vaping Use: Never used  Substance Use Topics   Alcohol use: Yes    Comment: rare   Drug use: No   Social History   Socioeconomic History   Marital status: Married    Spouse name: Not on file   Number of children: Not on file   Years of education: Not on file   Highest education level: Not on file  Occupational History   Not on file  Tobacco Use   Smoking status: Former    Packs/day: 0.50    Types: Cigarettes    Start date: 03/05/1975    Quit date: 03/04/1977    Years since quitting: 45.5   Smokeless tobacco: Never  Vaping Use   Vaping Use: Never used  Substance and Sexual Activity   Alcohol use: Yes    Comment: rare   Drug use: No   Sexual activity: Not on file  Other Topics Concern    Not on file  Social History Narrative   Not on file   Social Determinants of Health   Financial Resource Strain: Not on file  Food Insecurity: Not on file  Transportation Needs: Not on file  Physical Activity: Not on file  Stress: Not on file  Social Connections: Not on file  Intimate Partner Violence: Not on file   Family Status  Relation Name Status   Mother  Deceased   Father  Alive   MGM  Deceased   MGF  Deceased   PGM  Deceased   PGF  Deceased   Family History  Problem Relation Age of Onset   Stroke Mother    Mental illness Mother    Diabetes Mother    Heart disease Father    Hypertension Father    Allergies  Allergen Reactions   Penicillins Rash   Xyzal [Levocetirizine Dihydrochloride]     Fatigue    Nexium [Esomeprazole Magnesium] Other (See Comments)    constipation   Paxil [Paroxetine Hcl] Other (See Comments)    fatigue      Patient Care Team: Baruch Gouty, FNP as PCP - General (Family Medicine)   Outpatient Medications Prior to Visit  Medication Sig   cetirizine (ZYRTEC) 10 MG tablet TAKE ONE TABLET ONCE DAILY   losartan-hydrochlorothiazide (HYZAAR) 50-12.5 MG tablet Take 1 tablet by mouth daily.   [DISCONTINUED] fluticasone (FLONASE) 50 MCG/ACT nasal spray USE 2 SPRAYS IN EACH NOSTRIL ONCE DAILY   [DISCONTINUED] hydrOXYzine (VISTARIL) 25 MG capsule Take 2 capsules (50 mg total) by mouth every 6 (six) hours as needed for anxiety.   [DISCONTINUED] sertraline (ZOLOFT) 50 MG tablet TAKE ONE TABLET ONCE DAILY   [DISCONTINUED] traZODone (DESYREL) 50 MG tablet Take 1 tablet (50 mg total) by mouth at bedtime.   No facility-administered medications prior to visit.    Review of Systems  Constitutional:  Positive for malaise/fatigue. Negative for chills, diaphoresis, fever and weight loss.  HENT:  Positive for congestion. Negative for ear discharge, ear pain, hearing loss, nosebleeds, sinus pain, sore throat and tinnitus.   Eyes: Negative.    Respiratory:  Negative for cough, hemoptysis, sputum production, shortness of breath, wheezing and stridor.   Cardiovascular:  Negative for chest pain, palpitations, orthopnea, claudication, leg swelling and PND.  Gastrointestinal: Negative.   Genitourinary: Negative.   Musculoskeletal: Negative.   Skin:  Negative for itching and rash.  Neurological: Negative.   Endo/Heme/Allergies: Negative.   Psychiatric/Behavioral:  Positive for depression. Negative for hallucinations, memory loss, substance abuse and suicidal ideas. The patient is nervous/anxious and has insomnia.   All other systems reviewed and are negative.         Objective:     BP 134/76   Pulse 61   Temp (!) 97.1 F (36.2 C) (Temporal)   Ht '5\' 11"'$  (1.803 m)   Wt 133 lb 3.2 oz (60.4 kg)   SpO2 100%   BMI 18.58 kg/m  BP Readings from Last 3 Encounters:  09/01/22 134/76  03/01/22 129/84  10/28/21 138/88   Wt Readings from Last 3 Encounters:  09/01/22 133 lb 3.2 oz (60.4 kg)  03/01/22 141 lb (64 kg)  10/28/21 180 lb (81.6 kg)      Physical Exam Vitals and nursing note reviewed.  Constitutional:      General: He is not in acute distress.    Appearance: Normal appearance. He is well-developed, well-groomed and normal weight. He is not ill-appearing, toxic-appearing or diaphoretic.  HENT:     Head: Normocephalic and atraumatic.     Jaw: There is normal jaw occlusion.     Right Ear: Hearing, tympanic membrane, ear canal and external ear normal.     Left Ear: Hearing, tympanic membrane, ear canal and external ear normal.     Nose: Nose normal.     Mouth/Throat:     Lips: Pink.     Mouth: Mucous membranes are moist.     Pharynx: Oropharynx is clear. Uvula midline.  Eyes:     General: Lids are normal.     Extraocular Movements: Extraocular movements intact.     Conjunctiva/sclera: Conjunctivae normal.     Pupils: Pupils are equal, round, and reactive to light.  Neck:     Thyroid: No thyroid mass,  thyromegaly or thyroid tenderness.     Vascular: No carotid bruit or JVD.     Trachea: Trachea and phonation normal.  Cardiovascular:     Rate and Rhythm: Normal rate and regular rhythm.     Chest Wall: PMI is not displaced.     Pulses: Normal pulses.     Heart sounds: Normal heart sounds. No murmur heard.    No friction rub. No gallop.  Pulmonary:     Effort: Pulmonary effort is normal. No respiratory distress.     Breath sounds: Normal breath sounds. No wheezing.  Abdominal:     General: Bowel sounds are normal. There is no distension or abdominal bruit.     Palpations: Abdomen is soft. There is no hepatomegaly or splenomegaly.     Tenderness: There is no abdominal tenderness. There is no right CVA tenderness or left CVA tenderness.     Hernia: No hernia is present.  Musculoskeletal:        General: Normal range of motion.     Cervical back: Normal range of motion and neck supple.     Right lower leg: No edema.     Left lower leg: No edema.  Lymphadenopathy:     Cervical: No cervical adenopathy.  Skin:    General: Skin is warm and dry.     Capillary Refill: Capillary refill takes less than 2 seconds.     Coloration: Skin is not cyanotic, jaundiced or pale.     Findings: No rash.  Neurological:  General: No focal deficit present.     Mental Status: He is alert and oriented to person, place, and time.     Sensory: Sensation is intact.     Motor: Motor function is intact.     Coordination: Coordination is intact.     Gait: Gait is intact.     Deep Tendon Reflexes: Reflexes are normal and symmetric.  Psychiatric:        Attention and Perception: Attention and perception normal.        Mood and Affect: Mood and affect normal.        Speech: Speech normal.        Behavior: Behavior normal. Behavior is cooperative.        Thought Content: Thought content normal.        Cognition and Memory: Cognition and memory normal.        Judgment: Judgment normal.       Last  CBC Lab Results  Component Value Date   WBC 6.0 03/01/2022   HGB 15.2 03/01/2022   HCT 46.1 03/01/2022   MCV 93 03/01/2022   MCH 30.6 03/01/2022   RDW 12.8 03/01/2022   PLT 354 [id number]   Last metabolic panel Lab Results  Component Value Date   GLUCOSE 104 (H) 03/01/2022   NA 140 03/01/2022   K 4.5 03/01/2022   CL 100 03/01/2022   CO2 25 03/01/2022   BUN 13 03/01/2022   CREATININE 1.19 03/01/2022   EGFR 68 03/01/2022   CALCIUM 9.8 03/01/2022   PROT 6.9 03/01/2022   ALBUMIN 4.6 03/01/2022   LABGLOB 2.3 03/01/2022   AGRATIO 2.0 03/01/2022   BILITOT 0.5 03/01/2022   ALKPHOS 117 03/01/2022   AST 21 03/01/2022   ALT 20 03/01/2022   Last lipids Lab Results  Component Value Date   CHOL 168 03/01/2022   HDL 45 03/01/2022   LDLCALC 103 (H) 03/01/2022   TRIG 109 03/01/2022   CHOLHDL 3.7 03/01/2022   Last hemoglobin A1c Lab Results  Component Value Date   HGBA1C 4.9 07/16/2021   Last thyroid functions Lab Results  Component Value Date   TSH 2.200 03/01/2022   T4TOTAL 7.3 03/01/2022   Last vitamin D Lab Results  Component Value Date   VD25OH 30.1 03/01/2022   Last vitamin B12 and Folate Lab Results  Component Value Date   VITAMINB12 467 11/21/2015        Assessment & Plan:    Routine Health Maintenance and Physical Exam  Immunization History  Administered Date(s) Administered   Tdap 11/22/2017    Health Maintenance  Topic Date Due   Medicare Annual Wellness (AWV)  Never done   Fecal DNA (Cologuard)  Never done   Hepatitis C Screening  03/02/2023 (Originally [birth year]05/1975)   DTaP/Tdap/Td (2 - Td or Tdap) 11/23/2027   HIV Screening  Completed   HPV VACCINES  Aged Out   Pneumonia Vaccine 71+ Years old  Discontinued   INFLUENZA VACCINE  Discontinued   COVID-19 Vaccine  Discontinued   Zoster Vaccines- Shingrix  Discontinued    Discussed health benefits of physical activity, and encouraged him to engage in regular exercise appropriate for his age and  condition.  Problem List Items Addressed This Visit       Respiratory   Seasonal allergic rhinitis due to pollen   Relevant Medications   azelastine (ASTELIN) 0.1 % nasal spray     Other   GAD (generalized anxiety disorder)   Relevant Medications   sertraline (  ZOLOFT) 50 MG tablet   traZODone (DESYREL) 50 MG tablet   Vitamin D deficiency   Relevant Orders   VITAMIN D 25 Hydroxy (Vit-D Deficiency, Fractures)   Adjustment insomnia   Relevant Medications   traZODone (DESYREL) 50 MG tablet   Other Visit Diagnoses     Annual physical exam    -  Primary   Relevant Orders   Cologuard   Lipid panel   Hepatitis C antibody   HIV Antibody (routine testing w rflx)   CBC with Differential/Platelet   CMP14+EGFR   Thyroid Panel With TSH   PSA, total and free   Need for hepatitis C screening test       Relevant Orders   Hepatitis C antibody   Screening for prostate cancer       Relevant Orders   PSA, total and free   Screening for HIV (human immunodeficiency virus)       Relevant Orders   HIV Antibody (routine testing w rflx)   Screening for colorectal cancer       Relevant Orders   Cologuard      Return in 6 months (on 03/02/2023) for chronic follow up, schedule AWV.     Monia Pouch, FNP

## 2022-09-02 LAB — LIPID PANEL
Chol/HDL Ratio: 2.6 ratio (ref 0.0–5.0)
Cholesterol, Total: 153 mg/dL (ref 100–199)
HDL: 58 mg/dL (ref >39)
LDL Chol Calc (NIH): 84 mg/dL (ref 0–99)
Triglycerides: 50 mg/dL (ref 0–149)
VLDL Cholesterol Cal: 11 mg/dL (ref 5–40)

## 2022-09-02 LAB — CBC WITH DIFFERENTIAL/PLATELET
Basophils Absolute: 0 10*3/uL (ref 0.0–0.2)
Basos: 1 %
EOS (ABSOLUTE): 0 10*3/uL (ref 0.0–0.4)
Eos: 1 %
Hematocrit: 43.8 % (ref 37.5–51.0)
Hemoglobin: 14.3 g/dL (ref 13.0–17.7)
Immature Grans (Abs): 0 10*3/uL (ref 0.0–0.1)
Immature Granulocytes: 0 %
Lymphocytes Absolute: 1.1 10*3/uL (ref 0.7–3.1)
Lymphs: 30 %
MCH: 31 pg (ref 26.6–33.0)
MCHC: 32.6 g/dL (ref 31.5–35.7)
MCV: 95 fL (ref 79–97)
Monocytes Absolute: 0.3 10*3/uL (ref 0.1–0.9)
Monocytes: 7 %
Neutrophils Absolute: 2.3 10*3/uL (ref 1.4–7.0)
Neutrophils: 61 %
Platelets: 257 10*3/uL (ref 150–450)
RBC: 4.62 x10E6/uL (ref 4.14–5.80)
RDW: 12.5 % (ref 11.6–15.4)
WBC: 3.7 10*3/uL (ref 3.4–10.8)

## 2022-09-02 LAB — HEPATITIS C ANTIBODY: Hep C Virus Ab: NONREACTIVE

## 2022-09-02 LAB — PSA, TOTAL AND FREE
PSA, Free Pct: 38.8 %
PSA, Free: 0.31 ng/mL
Prostate Specific Ag, Serum: 0.8 ng/mL (ref 0.0–4.0)

## 2022-09-02 LAB — CMP14+EGFR
ALT: 18 IU/L (ref 0–44)
AST: 23 IU/L (ref 0–40)
Albumin/Globulin Ratio: 2.1 (ref 1.2–2.2)
Albumin: 4.2 g/dL (ref 3.9–4.9)
Alkaline Phosphatase: 133 IU/L — ABNORMAL HIGH (ref 44–121)
BUN/Creatinine Ratio: 15 (ref 10–24)
BUN: 15 mg/dL (ref 8–27)
Bilirubin Total: 0.5 mg/dL (ref 0.0–1.2)
CO2: 24 mmol/L (ref 20–29)
Calcium: 9.3 mg/dL (ref 8.6–10.2)
Chloride: 105 mmol/L (ref 96–106)
Creatinine, Ser: 0.98 mg/dL (ref 0.76–1.27)
Globulin, Total: 2 g/dL (ref 1.5–4.5)
Glucose: 135 mg/dL — ABNORMAL HIGH (ref 70–99)
Potassium: 4.7 mmol/L (ref 3.5–5.2)
Sodium: 142 mmol/L (ref 134–144)
Total Protein: 6.2 g/dL (ref 6.0–8.5)
eGFR: 86 mL/min/{1.73_m2} (ref >59)

## 2022-09-02 LAB — VITAMIN D 25 HYDROXY (VIT D DEFICIENCY, FRACTURES): Vit D, 25-Hydroxy: 32.2 ng/mL (ref 30.0–100.0)

## 2022-09-02 LAB — HIV ANTIBODY (ROUTINE TESTING W REFLEX): HIV Screen 4th Generation wRfx: NONREACTIVE

## 2022-09-02 LAB — THYROID PANEL WITH TSH
Free Thyroxine Index: 1.9 (ref 1.2–4.9)
T3 Uptake Ratio: 29 % (ref 24–39)
T4, Total: 6.4 ug/dL (ref 4.5–12.0)
TSH: 2.48 u[IU]/mL (ref 0.450–4.500)

## 2022-09-05 LAB — SPECIMEN STATUS REPORT

## 2022-09-05 LAB — HGB A1C W/O EAG: Hgb A1c MFr Bld: 5.4 % (ref 4.8–5.6)

## 2022-09-08 DIAGNOSIS — Z1212 Encounter for screening for malignant neoplasm of rectum: Secondary | ICD-10-CM | POA: Diagnosis not present

## 2022-09-08 DIAGNOSIS — Z1211 Encounter for screening for malignant neoplasm of colon: Secondary | ICD-10-CM | POA: Diagnosis not present

## 2022-09-13 ENCOUNTER — Encounter: Payer: No Typology Code available for payment source | Admitting: Family Medicine

## 2022-09-14 ENCOUNTER — Encounter: Payer: No Typology Code available for payment source | Admitting: Family Medicine

## 2022-09-16 LAB — COLOGUARD: COLOGUARD: NEGATIVE

## 2022-09-28 ENCOUNTER — Ambulatory Visit (INDEPENDENT_AMBULATORY_CARE_PROVIDER_SITE_OTHER): Payer: No Typology Code available for payment source | Admitting: Family Medicine

## 2022-09-28 ENCOUNTER — Encounter: Payer: Self-pay | Admitting: Family Medicine

## 2022-09-28 VITALS — BP 146/72 | HR 62 | Temp 97.7°F | Ht 71.0 in | Wt 131.0 lb

## 2022-09-28 DIAGNOSIS — Z0000 Encounter for general adult medical examination without abnormal findings: Secondary | ICD-10-CM | POA: Diagnosis not present

## 2022-09-28 NOTE — Progress Notes (Signed)
Subjective:   Kevin Brady is a 67 y.o. male who presents for a Welcome to Medicare exam.   Review of Systems  Psychiatric/Behavioral:  Positive for depression and memory loss. Negative for hallucinations, substance abuse and suicidal ideas. The patient is nervous/anxious. The patient does not have insomnia.   All other systems reviewed and are negative.   Cardiac Risk Factors include: advanced age (>49men, >79 women);male gender     Objective:    Today's Vitals   09/28/22 1052 09/28/22 1054  BP: (!) 160/81 139/72  Pulse: 62   Temp: 97.7 F (36.5 C)   TempSrc: Temporal   SpO2: 100%   Weight: 131 lb (59.4 kg)   Height: 5\' 11"  (1.803 m)    Body mass index is 18.27 kg/m.  Medications Outpatient Encounter Medications as of 09/28/2022  Medication Sig   azelastine (ASTELIN) 0.1 % nasal spray Place 2 sprays into both nostrils 2 (two) times daily. Use in each nostril as directed   losartan-hydrochlorothiazide (HYZAAR) 50-12.5 MG tablet Take 1 tablet by mouth daily.   sertraline (ZOLOFT) 50 MG tablet Take 1 tablet (50 mg total) by mouth daily.   traZODone (DESYREL) 50 MG tablet Take 1 tablet (50 mg total) by mouth at bedtime.   cetirizine (ZYRTEC) 10 MG tablet TAKE ONE TABLET ONCE DAILY (Patient not taking: Reported on 09/28/2022)   No facility-administered encounter medications on file as of 09/28/2022.     History: Past Medical History:  Diagnosis Date   Arthritis    History reviewed. No pertinent surgical history.  Family History  Problem Relation Age of Onset   Stroke Mother    Mental illness Mother    Diabetes Mother    Heart disease Father    Hypertension Father    Social History   Occupational History   Not on file  Tobacco Use   Smoking status: Former    Packs/day: .5    Types: Cigarettes    Start date: 03/05/1975    Quit date: 03/04/1977    Years since quitting: 45.6   Smokeless tobacco: Never  Vaping Use   Vaping Use: Never used  Substance and Sexual  Activity   Alcohol use: Yes    Comment: rare   Drug use: No   Sexual activity: Not on file   Tobacco Counseling Counseling given: Not Answered   Immunizations and Health Maintenance Immunization History  Administered Date(s) Administered   Tdap 11/22/2017   There are no preventive care reminders to display for this patient.   Activities of Daily Living    09/28/2022   10:56 AM 09/28/2022   10:54 AM  In your present state of health, do you have any difficulty performing the following activities:  Hearing?  1  Vision?  0  Difficulty concentrating or making decisions?  1  Walking or climbing stairs?  1  Dressing or bathing?  0  Doing errands, shopping?  0  Preparing Food and eating ? N   Using the Toilet? N   In the past six months, have you accidently leaked urine? Y   Do you have problems with loss of bowel control? N   Managing your Medications? N   Managing your Finances? N   Housekeeping or managing your Housekeeping? N     Advanced Directives: Does Patient Have a Medical Advance Directive?: Yes    Assessment:    Stephens was seen today for welcome to medicare.  Diagnoses and all orders for this visit:  Encounter  for Medicare annual wellness exam Overall health along with health maintenance discussed detail.    Dietary issues and exercise activities discussed:  Exercise limited by: No limitations    Goals   None     Depression Screen    09/28/2022   11:03 AM 09/01/2022   10:13 AM 03/01/2022   10:23 AM 10/28/2021   10:44 AM  PHQ 2/9 Scores  PHQ - 2 Score 3 3 4 3   PHQ- 9 Score 10 9 15 8      Fall Risk    09/28/2022   11:03 AM  Fall Risk   Falls in the past year? 1  Number falls in past yr: 0  Injury with Fall? 0  Follow up Falls prevention discussed    Cognitive Function    09/28/2022   10:59 AM  MMSE - Mini Mental State Exam  Orientation to time 4  Orientation to Place 5  Registration 3  Attention/ Calculation 5  Recall 2  Language-  name 2 objects 2  Language- repeat 1  Language- follow 3 step command 3  Language- read & follow direction 1  Write a sentence 1  Copy design 1  Total score 28        Patient Care Team: Baruch Gouty, FNP as PCP - General (Family Medicine)     Plan:  Declined influenza vaccine. Health Maintenance discussed.   I have personally reviewed and noted the following in the patient's chart:   Medical and social history Use of alcohol, tobacco or illicit drugs  Current medications and supplements Functional ability and status Nutritional status Physical activity Advanced directives List of other physicians Hospitalizations, surgeries, and ER visits in previous 12 months Vitals Screenings to include cognitive, depression, and falls Referrals and appointments  In addition, I have reviewed and discussed with patient certain preventive protocols, quality metrics, and best practice recommendations. A written personalized care plan for preventive services as well as general preventive health recommendations were provided to patient.   Monia Pouch, FNP-C [facility] [address] [place], [place] [address] [phone]

## 2022-09-30 DIAGNOSIS — Z681 Body mass index (BMI) 19 or less, adult: Secondary | ICD-10-CM | POA: Diagnosis not present

## 2022-09-30 DIAGNOSIS — R2681 Unsteadiness on feet: Secondary | ICD-10-CM | POA: Diagnosis not present

## 2022-09-30 DIAGNOSIS — N182 Chronic kidney disease, stage 2 (mild): Secondary | ICD-10-CM | POA: Diagnosis not present

## 2022-09-30 DIAGNOSIS — F419 Anxiety disorder, unspecified: Secondary | ICD-10-CM | POA: Diagnosis not present

## 2022-09-30 DIAGNOSIS — I129 Hypertensive chronic kidney disease with stage 1 through stage 4 chronic kidney disease, or unspecified chronic kidney disease: Secondary | ICD-10-CM | POA: Diagnosis not present

## 2022-09-30 DIAGNOSIS — Z008 Encounter for other general examination: Secondary | ICD-10-CM | POA: Diagnosis not present

## 2022-10-28 DIAGNOSIS — H52223 Regular astigmatism, bilateral: Secondary | ICD-10-CM | POA: Diagnosis not present

## 2022-10-28 DIAGNOSIS — H40033 Anatomical narrow angle, bilateral: Secondary | ICD-10-CM | POA: Diagnosis not present

## 2022-10-28 DIAGNOSIS — H2513 Age-related nuclear cataract, bilateral: Secondary | ICD-10-CM | POA: Diagnosis not present

## 2022-10-28 DIAGNOSIS — H524 Presbyopia: Secondary | ICD-10-CM | POA: Diagnosis not present

## 2022-10-28 DIAGNOSIS — H5203 Hypermetropia, bilateral: Secondary | ICD-10-CM | POA: Diagnosis not present

## 2023-01-18 ENCOUNTER — Other Ambulatory Visit: Payer: Self-pay | Admitting: *Deleted

## 2023-01-18 DIAGNOSIS — I10 Essential (primary) hypertension: Secondary | ICD-10-CM

## 2023-01-18 MED ORDER — LOSARTAN POTASSIUM-HCTZ 50-12.5 MG PO TABS
1.0000 | ORAL_TABLET | Freq: Every day | ORAL | 0 refills | Status: DC
Start: 2023-01-18 — End: 2023-05-05

## 2023-02-02 DIAGNOSIS — I129 Hypertensive chronic kidney disease with stage 1 through stage 4 chronic kidney disease, or unspecified chronic kidney disease: Secondary | ICD-10-CM | POA: Diagnosis not present

## 2023-02-02 DIAGNOSIS — Z008 Encounter for other general examination: Secondary | ICD-10-CM | POA: Diagnosis not present

## 2023-02-02 DIAGNOSIS — Z6820 Body mass index (BMI) 20.0-20.9, adult: Secondary | ICD-10-CM | POA: Diagnosis not present

## 2023-02-09 DIAGNOSIS — L821 Other seborrheic keratosis: Secondary | ICD-10-CM | POA: Diagnosis not present

## 2023-02-09 DIAGNOSIS — L570 Actinic keratosis: Secondary | ICD-10-CM | POA: Diagnosis not present

## 2023-02-09 DIAGNOSIS — L720 Epidermal cyst: Secondary | ICD-10-CM | POA: Diagnosis not present

## 2023-02-09 DIAGNOSIS — D170 Benign lipomatous neoplasm of skin and subcutaneous tissue of head, face and neck: Secondary | ICD-10-CM | POA: Diagnosis not present

## 2023-02-09 DIAGNOSIS — B078 Other viral warts: Secondary | ICD-10-CM | POA: Diagnosis not present

## 2023-03-21 ENCOUNTER — Ambulatory Visit: Payer: Self-pay

## 2023-03-21 ENCOUNTER — Other Ambulatory Visit: Payer: Self-pay

## 2023-03-21 NOTE — Progress Notes (Signed)
Spoke with patient, he does check blood pressure at home regularly.  His last blood pressure was 134/78.

## 2023-03-31 ENCOUNTER — Encounter: Payer: Self-pay | Admitting: Family Medicine

## 2023-03-31 ENCOUNTER — Ambulatory Visit (INDEPENDENT_AMBULATORY_CARE_PROVIDER_SITE_OTHER): Payer: No Typology Code available for payment source | Admitting: Family Medicine

## 2023-03-31 VITALS — BP 133/75 | HR 74 | Temp 98.3°F | Resp 20 | Ht 71.0 in | Wt 131.5 lb

## 2023-03-31 DIAGNOSIS — F411 Generalized anxiety disorder: Secondary | ICD-10-CM | POA: Diagnosis not present

## 2023-03-31 DIAGNOSIS — F4321 Adjustment disorder with depressed mood: Secondary | ICD-10-CM

## 2023-03-31 DIAGNOSIS — E559 Vitamin D deficiency, unspecified: Secondary | ICD-10-CM

## 2023-03-31 DIAGNOSIS — E785 Hyperlipidemia, unspecified: Secondary | ICD-10-CM

## 2023-03-31 DIAGNOSIS — I10 Essential (primary) hypertension: Secondary | ICD-10-CM | POA: Diagnosis not present

## 2023-03-31 LAB — CBC WITH DIFFERENTIAL/PLATELET
Basophils Absolute: 0 10*3/uL (ref 0.0–0.2)
Basos: 1 %
EOS (ABSOLUTE): 0 10*3/uL (ref 0.0–0.4)
Eos: 0 %
Hematocrit: 39.8 % (ref 37.5–51.0)
Hemoglobin: 13.5 g/dL (ref 13.0–17.7)
Immature Grans (Abs): 0 10*3/uL (ref 0.0–0.1)
Immature Granulocytes: 0 %
Lymphocytes Absolute: 1.2 10*3/uL (ref 0.7–3.1)
Lymphs: 25 %
MCH: 32 pg (ref 26.6–33.0)
MCHC: 33.9 g/dL (ref 31.5–35.7)
MCV: 94 fL (ref 79–97)
Monocytes Absolute: 0.4 10*3/uL (ref 0.1–0.9)
Monocytes: 8 %
Neutrophils Absolute: 3.1 10*3/uL (ref 1.4–7.0)
Neutrophils: 66 %
Platelets: 312 10*3/uL (ref 150–450)
RBC: 4.22 x10E6/uL (ref 4.14–5.80)
RDW: 12.1 % (ref 11.6–15.4)
WBC: 4.8 10*3/uL (ref 3.4–10.8)

## 2023-03-31 LAB — CMP14+EGFR
ALT: 17 [IU]/L (ref 0–44)
AST: 29 [IU]/L (ref 0–40)
Albumin: 4.2 g/dL (ref 3.9–4.9)
Alkaline Phosphatase: 122 [IU]/L — ABNORMAL HIGH (ref 44–121)
BUN/Creatinine Ratio: 16 (ref 10–24)
BUN: 16 mg/dL (ref 8–27)
Bilirubin Total: 0.9 mg/dL (ref 0.0–1.2)
CO2: 25 mmol/L (ref 20–29)
Calcium: 9.5 mg/dL (ref 8.6–10.2)
Chloride: 100 mmol/L (ref 96–106)
Creatinine, Ser: 1.02 mg/dL (ref 0.76–1.27)
Globulin, Total: 2.5 g/dL (ref 1.5–4.5)
Glucose: 91 mg/dL (ref 70–99)
Potassium: 3.9 mmol/L (ref 3.5–5.2)
Sodium: 138 mmol/L (ref 134–144)
Total Protein: 6.7 g/dL (ref 6.0–8.5)
eGFR: 81 mL/min/{1.73_m2} (ref >59)

## 2023-03-31 NOTE — Patient Instructions (Signed)

## 2023-03-31 NOTE — Progress Notes (Signed)
Subjective:  Patient ID: Kevin Brady, male    DOB: 11/26/1955, 67 y.o.   MRN: [id number]  Patient Care Team: Sonny Masters, FNP as PCP - General (Family Medicine)   Chief Complaint:  Medical Management of Chronic Issues   HPI: Kevin Brady is a 67 y.o. male presenting on 03/31/2023 for Medical Management of Chronic Issues    1. GAD (generalized anxiety disorder) 2. Situational depression Has finalized his divorce and feels this has relieved some of his anxiety and depressive symptoms. He stopped all medications and does not wish to restart them. No SI or HI.     03/31/2023   11:41 AM 09/28/2022   11:04 AM 09/01/2022   10:14 AM 03/01/2022   10:28 AM  GAD 7 : Generalized Anxiety Score  Nervous, Anxious, on Edge 2 2 2 3   Control/stop worrying 2 2 2 3   Worry too much - different things 2 2 2 2   Trouble relaxing 2 2 2 2   Restless 1 1 1 1   Easily annoyed or irritable 1 0 0 0  Afraid - awful might happen 2 2 2 2   Total GAD 7 Score 12 11 11 13   Anxiety Difficulty Somewhat difficult Somewhat difficult         03/31/2023   11:39 AM 09/28/2022   11:03 AM 09/01/2022   10:13 AM 03/01/2022   10:23 AM 10/28/2021   10:44 AM  Depression screen PHQ 2/9  Decreased Interest 1 1 1 2 1   Down, Depressed, Hopeless 2 2 2 2 2   PHQ - 2 Score 3 3 3 4 3   Altered sleeping 2 2 2 2 2   Tired, decreased energy 2 2 2 2 1   Change in appetite 0 0 0 2 1  Feeling bad or failure about yourself  2 2 1 3 1   Trouble concentrating 2 1 1 2  0  Moving slowly or fidgety/restless 0 0 0 0 0  Suicidal thoughts 0 0 0 0 0  PHQ-9 Score 11 10 9 15 8   Difficult doing work/chores  Somewhat difficult   Somewhat difficult    3. Primary hypertension Complaint with meds - Yes Current Medications - hyzaar, norvasc Checking BP at home - no Exercising Regularly - No Watching Salt intake - Yes Pertinent ROS:  Headache - No Fatigue - No Visual Disturbances - No Chest pain - No Dyspnea - No Palpitations - No LE  edema - No They report good compliance with medications and can restate their regimen by memory. No medication side effects.  BP Readings from Last 3 Encounters:  03/31/23 133/75  03/21/23 134/78  09/28/22 (!) 146/72     4. Hyperlipidemia LDL goal <100 Pt is not taking medications but reports he is trying to eat healthier. Does stay active daily.   5. Vitamin D deficiency Pt is not taking oral repletion therapy. Denies bone pain and tenderness, muscle weakness, fracture, and difficulty walking. Lab Results  Component Value Date   VD25OH 32.2 09/01/2022   VD25OH 30.1 03/01/2022   VD25OH 44.9 02/23/2021   Lab Results  Component Value Date   CALCIUM 9.3 09/01/2022         Relevant past medical, surgical, family, and social history reviewed and updated as indicated.  Allergies and medications reviewed and updated. Data reviewed: Chart in Epic.   Past Medical History:  Diagnosis Date   Arthritis     History reviewed. No pertinent surgical history.  Social History   Socioeconomic  History   Marital status: Married    Spouse name: Not on file   Number of children: Not on file   Years of education: Not on file   Highest education level: Not on file  Occupational History   Not on file  Tobacco Use   Smoking status: Former    Current packs/day: 0.00    Average packs/day: 0.5 packs/day for 2.0 years (1.0 ttl pk-yrs)    Types: Cigarettes    Start date: 03/05/1975    Quit date: 03/04/1977    Years since quitting: 46.1   Smokeless tobacco: Never  Vaping Use   Vaping status: Never Used  Substance and Sexual Activity   Alcohol use: Yes    Comment: rare   Drug use: No   Sexual activity: Not on file  Other Topics Concern   Not on file  Social History Narrative   Not on file   Social Determinants of Health   Financial Resource Strain: Not on file  Food Insecurity: Not on file  Transportation Needs: Not on file  Physical Activity: Not on file  Stress: Not on file   Social Connections: Not on file  Intimate Partner Violence: Not on file    Outpatient Encounter Medications as of 03/31/2023  Medication Sig   amLODipine (NORVASC) 5 MG tablet Take 5 mg by mouth daily.   losartan-hydrochlorothiazide (HYZAAR) 50-12.5 MG tablet Take 1 tablet by mouth daily.   [DISCONTINUED] azelastine (ASTELIN) 0.1 % nasal spray Place 2 sprays into both nostrils 2 (two) times daily. Use in each nostril as directed (Patient not taking: Reported on 03/31/2023)   [DISCONTINUED] cetirizine (ZYRTEC) 10 MG tablet TAKE ONE TABLET ONCE DAILY (Patient not taking: Reported on 03/31/2023)   [DISCONTINUED] sertraline (ZOLOFT) 50 MG tablet Take 1 tablet (50 mg total) by mouth daily. (Patient not taking: Reported on 03/31/2023)   [DISCONTINUED] traZODone (DESYREL) 50 MG tablet Take 1 tablet (50 mg total) by mouth at bedtime. (Patient not taking: Reported on 03/31/2023)   No facility-administered encounter medications on file as of 03/31/2023.    Allergies  Allergen Reactions   Penicillins Rash   Fish Oil    Xyzal [Levocetirizine Dihydrochloride]     Fatigue    Nexium [Esomeprazole Magnesium] Other (See Comments)    constipation   Paxil [Paroxetine Hcl] Other (See Comments)    fatigue    Review of Systems  Constitutional:  Positive for activity change, appetite change and fatigue. Negative for chills, diaphoresis, fever and unexpected weight change.  HENT: Negative.    Eyes: Negative.  Negative for photophobia and visual disturbance.  Respiratory:  Negative for cough, chest tightness and shortness of breath.   Cardiovascular:  Negative for chest pain, palpitations and leg swelling.  Gastrointestinal:  Negative for abdominal pain, blood in stool, constipation, diarrhea, nausea and vomiting.  Endocrine: Negative.   Genitourinary:  Negative for decreased urine volume, difficulty urinating, dysuria, frequency and urgency.  Musculoskeletal:  Negative for arthralgias and myalgias.  Skin:  Negative.   Allergic/Immunologic: Negative.   Neurological:  Negative for dizziness and headaches.  Hematological: Negative.   Psychiatric/Behavioral:  Positive for agitation, decreased concentration, dysphoric mood and sleep disturbance. Negative for behavioral problems, confusion, hallucinations, self-injury and suicidal ideas. The patient is nervous/anxious. The patient is not hyperactive.   All other systems reviewed and are negative.       Objective:  BP 133/75   Pulse 74   Temp 98.3 F (36.8 C) (Oral)   Resp 20  Ht 5\' 11"  (1.803 m)   Wt 131 lb 8 oz (59.6 kg)   SpO2 99%   BMI 18.34 kg/m    Wt Readings from Last 3 Encounters:  03/31/23 131 lb 8 oz (59.6 kg)  09/28/22 131 lb (59.4 kg)  09/01/22 133 lb 3.2 oz (60.4 kg)    Physical Exam Vitals and nursing note reviewed.  Constitutional:      General: He is not in acute distress.    Appearance: Normal appearance. He is well-developed, well-groomed and normal weight. He is not ill-appearing, toxic-appearing or diaphoretic.  HENT:     Head: Normocephalic and atraumatic.     Jaw: There is normal jaw occlusion.     Right Ear: Hearing normal.     Left Ear: Hearing normal.     Nose: Nose normal.     Mouth/Throat:     Lips: Pink.     Mouth: Mucous membranes are moist.     Pharynx: Oropharynx is clear. Uvula midline.  Eyes:     General: Lids are normal.     Extraocular Movements: Extraocular movements intact.     Conjunctiva/sclera: Conjunctivae normal.     Pupils: Pupils are equal, round, and reactive to light.  Neck:     Trachea: Trachea and phonation normal.  Cardiovascular:     Rate and Rhythm: Normal rate and regular rhythm.     Chest Wall: PMI is not displaced.     Pulses: Normal pulses.     Heart sounds: Normal heart sounds. No murmur heard.    No friction rub. No gallop.  Pulmonary:     Effort: Pulmonary effort is normal. No respiratory distress.     Breath sounds: Normal breath sounds. No wheezing.   Abdominal:     General: Bowel sounds are normal.     Palpations: Abdomen is soft.  Musculoskeletal:        General: Normal range of motion.     Cervical back: Normal range of motion and neck supple.     Right lower leg: No edema.     Left lower leg: No edema.  Skin:    General: Skin is warm and dry.     Capillary Refill: Capillary refill takes less than 2 seconds.     Coloration: Skin is not cyanotic, jaundiced or pale.     Findings: No rash.  Neurological:     General: No focal deficit present.     Mental Status: He is alert and oriented to person, place, and time.     Sensory: Sensation is intact.     Motor: Motor function is intact.     Coordination: Coordination is intact.     Gait: Gait is intact.     Deep Tendon Reflexes: Reflexes are normal and symmetric.  Psychiatric:        Attention and Perception: Attention and perception normal.        Mood and Affect: Mood and affect normal.        Speech: Speech normal.        Behavior: Behavior normal. Behavior is cooperative.        Thought Content: Thought content normal.        Cognition and Memory: Cognition and memory normal.        Judgment: Judgment normal.     Results for orders placed or performed in visit on 09/01/22  Cologuard  Result Value Ref Range   COLOGUARD Negative Negative  Lipid panel  Result Value Ref Range  Cholesterol, Total 153 100 - 199 mg/dL   Triglycerides 50 0 - 149 mg/dL   HDL 58 >16 mg/dL   VLDL Cholesterol Cal 11 5 - 40 mg/dL   LDL Chol Calc (NIH) 84 0 - 99 mg/dL   Chol/HDL Ratio 2.6 0.0 - 5.0 ratio  Hepatitis C antibody  Result Value Ref Range   Hep C Virus Ab Non Reactive Non Reactive  HIV Antibody (routine testing w rflx)  Result Value Ref Range   HIV Screen 4th Generation wRfx Non Reactive Non Reactive  CBC with Differential/Platelet  Result Value Ref Range   WBC 3.7 3.4 - 10.8 x10E3/uL   RBC 4.62 4.14 - 5.80 x10E6/uL   Hemoglobin 14.3 13.0 - 17.7 g/dL   Hematocrit 10.9 60.4 -  51.0 %   MCV 95 79 - 97 fL   MCH 31.0 26.6 - 33.0 pg   MCHC 32.6 31.5 - 35.7 g/dL   RDW 54.0 98.1 - 19.1 %   Platelets 257 150 - 450 x10E3/uL   Neutrophils 61 Not Estab. %   Lymphs 30 Not Estab. %   Monocytes 7 Not Estab. %   Eos 1 Not Estab. %   Basos 1 Not Estab. %   Neutrophils Absolute 2.3 1.4 - 7.0 x10E3/uL   Lymphocytes Absolute 1.1 0.7 - 3.1 x10E3/uL   Monocytes Absolute 0.3 0.1 - 0.9 x10E3/uL   EOS (ABSOLUTE) 0.0 0.0 - 0.4 x10E3/uL   Basophils Absolute 0.0 0.0 - 0.2 x10E3/uL   Immature Granulocytes 0 Not Estab. %   Immature Grans (Abs) 0.0 0.0 - 0.1 x10E3/uL  CMP14+EGFR  Result Value Ref Range   Glucose 135 (H) 70 - 99 mg/dL   BUN 15 8 - 27 mg/dL   Creatinine, Ser 4.78 0.76 - 1.27 mg/dL   eGFR 86 >29 FA/OZH/0.86   BUN/Creatinine Ratio 15 10 - 24   Sodium 142 134 - 144 mmol/L   Potassium 4.7 3.5 - 5.2 mmol/L   Chloride 105 96 - 106 mmol/L   CO2 24 20 - 29 mmol/L   Calcium 9.3 8.6 - 10.2 mg/dL   Total Protein 6.2 6.0 - 8.5 g/dL   Albumin 4.2 3.9 - 4.9 g/dL   Globulin, Total 2.0 1.5 - 4.5 g/dL   Albumin/Globulin Ratio 2.1 1.2 - 2.2   Bilirubin Total 0.5 0.0 - 1.2 mg/dL   Alkaline Phosphatase 133 (H) 44 - 121 IU/L   AST 23 0 - 40 IU/L   ALT 18 0 - 44 IU/L  Thyroid Panel With TSH  Result Value Ref Range   TSH 2.480 0.450 - 4.500 uIU/mL   T4, Total 6.4 4.5 - 12.0 ug/dL   T3 Uptake Ratio 29 24 - 39 %   Free Thyroxine Index 1.9 1.2 - 4.9  VITAMIN D 25 Hydroxy (Vit-D Deficiency, Fractures)  Result Value Ref Range   Vit D, 25-Hydroxy 32.2 30.0 - 100.0 ng/mL  PSA, total and free  Result Value Ref Range   Prostate Specific Ag, Serum 0.8 0.0 - 4.0 ng/mL   PSA, Free 0.31 N/A ng/mL   PSA, Free Pct 38.8 %  Hgb A1c w/o eAG  Result Value Ref Range   Hgb A1c MFr Bld 5.4 4.8 - 5.6 %  Specimen status report  Result Value Ref Range   specimen status report Comment        Pertinent labs & imaging results that were available during my care of the patient were reviewed by  me and considered in my medical  decision making.  Assessment & Plan:  Kevin Brady was seen today for medical management of chronic issues.  Diagnoses and all orders for this visit:  GAD (generalized anxiety disorder) Situational depression States he does not wish to start medications again. Declines referral to counselor. Pt aware to report new, worsening, or persistent symptoms.   Primary hypertension BP well controlled. Changes were not made in regimen today. Goal BP is 130/80. Pt aware to report any persistent high or low readings. DASH diet and exercise encouraged. Exercise at least 150 minutes per week and increase as tolerated. Goal BMI > 25. Stress management encouraged. Avoid nicotine and tobacco product use. Avoid excessive alcohol and NSAID's. Avoid more than 2000 mg of sodium daily. Medications as prescribed. Follow up as scheduled.  -     CBC with Differential/Platelet -     CMP14+EGFR  Hyperlipidemia LDL goal <100 Diet encouraged - increase intake of fresh fruits and vegetables, increase intake of lean proteins. Bake, broil, or grill foods. Avoid fried, greasy, and fatty foods. Avoid fast foods. Increase intake of fiber-rich whole grains. Exercise encouraged - at least 150 minutes per week and advance as tolerated.   Vitamin D deficiency Eat foods rich in Vit D including milk, orange juice, yogurt with vitamin D added, salmon or mackerel, canned tuna fish, cereals with vitamin D added, and cod liver oil. Get out in the sun but make sure to wear at least SPF 30 sunscreen.      Continue all other maintenance medications.  Follow up plan: Return in 6 months (on 09/28/2023), or if symptoms worsen or fail to improve, for CPE.   Continue healthy lifestyle choices, including diet (rich in fruits, vegetables, and lean proteins, and low in salt and simple carbohydrates) and exercise (at least 30 minutes of moderate physical activity daily).  Educational handout given for DASH  The  above assessment and management plan was discussed with the patient. The patient verbalized understanding of and has agreed to the management plan. Patient is aware to call the clinic if they develop any new symptoms or if symptoms persist or worsen. Patient is aware when to return to the clinic for a follow-up visit. Patient educated on when it is appropriate to go to the emergency department.   Kari Baars, FNP-C [facility] [phone]

## 2023-05-05 ENCOUNTER — Other Ambulatory Visit: Payer: Self-pay | Admitting: Family Medicine

## 2023-05-05 DIAGNOSIS — I10 Essential (primary) hypertension: Secondary | ICD-10-CM

## 2023-08-08 ENCOUNTER — Encounter: Payer: Self-pay | Admitting: *Deleted

## 2023-09-14 DIAGNOSIS — E559 Vitamin D deficiency, unspecified: Secondary | ICD-10-CM | POA: Diagnosis not present

## 2023-09-14 DIAGNOSIS — Z0000 Encounter for general adult medical examination without abnormal findings: Secondary | ICD-10-CM | POA: Diagnosis not present

## 2023-09-14 DIAGNOSIS — E785 Hyperlipidemia, unspecified: Secondary | ICD-10-CM | POA: Diagnosis not present

## 2023-09-14 DIAGNOSIS — F5104 Psychophysiologic insomnia: Secondary | ICD-10-CM | POA: Diagnosis not present

## 2023-09-14 DIAGNOSIS — Z7689 Persons encountering health services in other specified circumstances: Secondary | ICD-10-CM | POA: Diagnosis not present

## 2023-09-14 DIAGNOSIS — F411 Generalized anxiety disorder: Secondary | ICD-10-CM | POA: Diagnosis not present

## 2023-09-14 DIAGNOSIS — J302 Other seasonal allergic rhinitis: Secondary | ICD-10-CM | POA: Diagnosis not present

## 2023-09-14 DIAGNOSIS — I10 Essential (primary) hypertension: Secondary | ICD-10-CM | POA: Diagnosis not present

## 2023-09-14 DIAGNOSIS — F4321 Adjustment disorder with depressed mood: Secondary | ICD-10-CM | POA: Diagnosis not present

## 2023-09-14 DIAGNOSIS — Z1330 Encounter for screening examination for mental health and behavioral disorders, unspecified: Secondary | ICD-10-CM | POA: Diagnosis not present

## 2023-09-14 DIAGNOSIS — Z125 Encounter for screening for malignant neoplasm of prostate: Secondary | ICD-10-CM | POA: Diagnosis not present

## 2023-09-29 ENCOUNTER — Encounter: Payer: No Typology Code available for payment source | Admitting: Family Medicine

## 2023-10-18 ENCOUNTER — Ambulatory Visit: Payer: No Typology Code available for payment source

## 2023-10-26 DIAGNOSIS — Z0000 Encounter for general adult medical examination without abnormal findings: Secondary | ICD-10-CM | POA: Diagnosis not present

## 2023-10-26 DIAGNOSIS — E559 Vitamin D deficiency, unspecified: Secondary | ICD-10-CM | POA: Diagnosis not present

## 2023-10-26 DIAGNOSIS — I10 Essential (primary) hypertension: Secondary | ICD-10-CM | POA: Diagnosis not present

## 2023-10-26 DIAGNOSIS — Z87891 Personal history of nicotine dependence: Secondary | ICD-10-CM | POA: Diagnosis not present

## 2023-10-26 DIAGNOSIS — F419 Anxiety disorder, unspecified: Secondary | ICD-10-CM | POA: Diagnosis not present

## 2023-10-26 DIAGNOSIS — F5105 Insomnia due to other mental disorder: Secondary | ICD-10-CM | POA: Diagnosis not present

## 2023-10-26 DIAGNOSIS — F411 Generalized anxiety disorder: Secondary | ICD-10-CM | POA: Diagnosis not present

## 2023-11-15 ENCOUNTER — Other Ambulatory Visit: Payer: Self-pay | Admitting: Family Medicine

## 2023-11-15 DIAGNOSIS — I10 Essential (primary) hypertension: Secondary | ICD-10-CM
